# Patient Record
Sex: Female | Born: 1965 | Race: White | Hispanic: No | Smoking: Never smoker
Health system: Southern US, Community
[De-identification: ages and names within clinical notes are randomized; demographics above are authoritative.]

## PROBLEM LIST (undated history)

## (undated) DIAGNOSIS — F988 Other specified behavioral and emotional disorders with onset usually occurring in childhood and adolescence: Secondary | ICD-10-CM

## (undated) HISTORY — PX: BREAST SURGERY: SHX581

## (undated) HISTORY — PX: ECTOPIC PREGNANCY SURGERY: SHX613

---

## 1998-03-10 ENCOUNTER — Other Ambulatory Visit: Admission: RE | Admit: 1998-03-10 | Discharge: 1998-03-10 | Payer: Self-pay | Admitting: Obstetrics and Gynecology

## 1998-06-18 ENCOUNTER — Inpatient Hospital Stay (HOSPITAL_COMMUNITY): Admission: AD | Admit: 1998-06-18 | Discharge: 1998-06-18 | Payer: Self-pay | Admitting: Obstetrics and Gynecology

## 1998-07-22 ENCOUNTER — Encounter: Payer: Self-pay | Admitting: Obstetrics and Gynecology

## 1998-07-22 ENCOUNTER — Inpatient Hospital Stay (HOSPITAL_COMMUNITY): Admission: AD | Admit: 1998-07-22 | Discharge: 1998-07-22 | Payer: Self-pay | Admitting: Obstetrics and Gynecology

## 1998-09-10 ENCOUNTER — Other Ambulatory Visit: Admission: RE | Admit: 1998-09-10 | Discharge: 1998-09-10 | Payer: Self-pay | Admitting: Obstetrics and Gynecology

## 1999-11-02 ENCOUNTER — Other Ambulatory Visit: Admission: RE | Admit: 1999-11-02 | Discharge: 1999-11-02 | Payer: Self-pay | Admitting: Obstetrics and Gynecology

## 2000-11-10 ENCOUNTER — Other Ambulatory Visit: Admission: RE | Admit: 2000-11-10 | Discharge: 2000-11-10 | Payer: Self-pay | Admitting: Obstetrics and Gynecology

## 2007-10-10 ENCOUNTER — Encounter: Admission: RE | Admit: 2007-10-10 | Discharge: 2007-10-10 | Payer: Self-pay | Admitting: Obstetrics and Gynecology

## 2008-11-04 ENCOUNTER — Encounter: Admission: RE | Admit: 2008-11-04 | Discharge: 2008-11-04 | Payer: Self-pay | Admitting: Obstetrics and Gynecology

## 2008-11-07 ENCOUNTER — Encounter: Admission: RE | Admit: 2008-11-07 | Discharge: 2008-11-07 | Payer: Self-pay | Admitting: Obstetrics and Gynecology

## 2009-10-07 ENCOUNTER — Ambulatory Visit (HOSPITAL_COMMUNITY)
Admission: AD | Admit: 2009-10-07 | Discharge: 2009-10-08 | Payer: Self-pay | Source: Home / Self Care | Admitting: Obstetrics & Gynecology

## 2010-07-07 ENCOUNTER — Ambulatory Visit (HOSPITAL_BASED_OUTPATIENT_CLINIC_OR_DEPARTMENT_OTHER)
Admission: RE | Admit: 2010-07-07 | Discharge: 2010-07-07 | Payer: Self-pay | Source: Home / Self Care | Attending: Obstetrics and Gynecology | Admitting: Obstetrics and Gynecology

## 2010-07-19 ENCOUNTER — Encounter: Payer: Self-pay | Admitting: Obstetrics and Gynecology

## 2010-07-20 ENCOUNTER — Encounter: Payer: Self-pay | Admitting: Obstetrics and Gynecology

## 2010-09-16 LAB — CBC
MCHC: 34.6 g/dL (ref 30.0–36.0)
MCV: 93.1 fL (ref 78.0–100.0)
Platelets: 240 10*3/uL (ref 150–400)
RBC: 3.71 MIL/uL — ABNORMAL LOW (ref 3.87–5.11)
RDW: 13.7 % (ref 11.5–15.5)

## 2010-09-16 LAB — SAMPLE TO BLOOD BANK

## 2010-09-16 LAB — HCG, QUANTITATIVE, PREGNANCY: hCG, Beta Chain, Quant, S: 4059 m[IU]/mL — ABNORMAL HIGH (ref <5)

## 2010-09-16 LAB — TYPE AND SCREEN: Antibody Screen: NEGATIVE

## 2011-07-01 ENCOUNTER — Other Ambulatory Visit: Payer: Self-pay | Admitting: Obstetrics and Gynecology

## 2011-07-01 DIAGNOSIS — N644 Mastodynia: Secondary | ICD-10-CM

## 2011-07-09 ENCOUNTER — Other Ambulatory Visit: Payer: Self-pay

## 2011-08-31 ENCOUNTER — Other Ambulatory Visit (HOSPITAL_BASED_OUTPATIENT_CLINIC_OR_DEPARTMENT_OTHER): Payer: Self-pay | Admitting: Obstetrics and Gynecology

## 2011-08-31 DIAGNOSIS — Z1231 Encounter for screening mammogram for malignant neoplasm of breast: Secondary | ICD-10-CM

## 2011-09-03 ENCOUNTER — Ambulatory Visit (HOSPITAL_BASED_OUTPATIENT_CLINIC_OR_DEPARTMENT_OTHER)
Admission: RE | Admit: 2011-09-03 | Discharge: 2011-09-03 | Disposition: A | Discharge status: Home / Self Care | Payer: BC Managed Care – PPO | Source: Ambulatory Visit | Attending: Obstetrics and Gynecology | Admitting: Obstetrics and Gynecology

## 2011-09-03 DIAGNOSIS — Z1231 Encounter for screening mammogram for malignant neoplasm of breast: Secondary | ICD-10-CM

## 2012-09-14 ENCOUNTER — Other Ambulatory Visit (HOSPITAL_BASED_OUTPATIENT_CLINIC_OR_DEPARTMENT_OTHER): Payer: Self-pay | Admitting: Obstetrics and Gynecology

## 2012-09-14 DIAGNOSIS — Z1231 Encounter for screening mammogram for malignant neoplasm of breast: Secondary | ICD-10-CM

## 2012-09-21 ENCOUNTER — Inpatient Hospital Stay (HOSPITAL_BASED_OUTPATIENT_CLINIC_OR_DEPARTMENT_OTHER): Admission: RE | Admit: 2012-09-21 | Payer: BC Managed Care – PPO | Source: Ambulatory Visit

## 2012-09-26 ENCOUNTER — Ambulatory Visit (HOSPITAL_BASED_OUTPATIENT_CLINIC_OR_DEPARTMENT_OTHER)
Admission: RE | Admit: 2012-09-26 | Discharge: 2012-09-26 | Disposition: A | Discharge status: Home / Self Care | Payer: BC Managed Care – PPO | Source: Ambulatory Visit | Attending: Obstetrics and Gynecology | Admitting: Obstetrics and Gynecology

## 2012-09-26 DIAGNOSIS — Z1231 Encounter for screening mammogram for malignant neoplasm of breast: Secondary | ICD-10-CM

## 2013-03-02 ENCOUNTER — Ambulatory Visit (INDEPENDENT_AMBULATORY_CARE_PROVIDER_SITE_OTHER): Payer: BC Managed Care – PPO | Admitting: Podiatry

## 2013-03-02 DIAGNOSIS — M25579 Pain in unspecified ankle and joints of unspecified foot: Secondary | ICD-10-CM

## 2013-03-02 DIAGNOSIS — M21969 Unspecified acquired deformity of unspecified lower leg: Secondary | ICD-10-CM

## 2013-03-02 DIAGNOSIS — M25571 Pain in right ankle and joints of right foot: Secondary | ICD-10-CM

## 2013-03-02 DIAGNOSIS — M21619 Bunion of unspecified foot: Secondary | ICD-10-CM

## 2013-03-02 DIAGNOSIS — M2010 Hallux valgus (acquired), unspecified foot: Secondary | ICD-10-CM

## 2013-03-02 MED ORDER — ARCH BANDAGE MISC: 2.0000 | Freq: Every morning | Status: AC

## 2013-03-02 NOTE — Progress Notes (Signed)
Subjective:  Right foot bunion hurts. Has had for over 2-3 years and progressively getting bigger especially last year.  Left foot 2nd and 3rd toes are spreading, noted past 2-3 weeks.   Objective: Dermatologic: Normal findings. Vascular: All pedal pulses are palpable. No edema or erythema noted. Neurologic: All epicritic and tactile sensations grossly intact. Orthopedic: Hallux valgus bilateral, bunion deformity bilateral, spreading 2nd and 3rd digit left, pain on right bunion. Excess motion at the base of the first MCJ bilateral. X-rays taken:  Findings reveal short first Metatarsal bilateral, medially deviating 2nd MPJ L>R. Enlarged first Metatarsal head bilateral. Fibular sesamoid position 4 right, 3 left.  Assessment: Hallux valgus with bunion bilateral, right foot symptomatic. Hypermobility of the first MCJ bilateral.  Plan: Reviewed clinical findings, X-ray findings, and available treatment options including surgical options ([place] with Adin Hector). Metatarsal binder dispensed. Patient will return to prepare for Orthotics.

## 2013-11-13 ENCOUNTER — Other Ambulatory Visit (HOSPITAL_BASED_OUTPATIENT_CLINIC_OR_DEPARTMENT_OTHER): Payer: Self-pay | Admitting: Obstetrics and Gynecology

## 2013-11-13 DIAGNOSIS — Z1231 Encounter for screening mammogram for malignant neoplasm of breast: Secondary | ICD-10-CM

## 2013-11-16 ENCOUNTER — Ambulatory Visit (HOSPITAL_BASED_OUTPATIENT_CLINIC_OR_DEPARTMENT_OTHER): Payer: BC Managed Care – PPO

## 2013-11-23 ENCOUNTER — Ambulatory Visit (HOSPITAL_BASED_OUTPATIENT_CLINIC_OR_DEPARTMENT_OTHER)
Admission: RE | Admit: 2013-11-23 | Discharge: 2013-11-23 | Disposition: A | Discharge status: Home / Self Care | Payer: BC Managed Care – PPO | Source: Ambulatory Visit | Attending: Obstetrics and Gynecology | Admitting: Obstetrics and Gynecology

## 2013-11-23 DIAGNOSIS — Z1231 Encounter for screening mammogram for malignant neoplasm of breast: Secondary | ICD-10-CM | POA: Insufficient documentation

## 2017-03-04 ENCOUNTER — Encounter (HOSPITAL_BASED_OUTPATIENT_CLINIC_OR_DEPARTMENT_OTHER): Payer: Self-pay | Admitting: *Deleted

## 2017-03-04 DIAGNOSIS — K353 Acute appendicitis with localized peritonitis: Secondary | ICD-10-CM | POA: Diagnosis not present

## 2017-03-04 DIAGNOSIS — F988 Other specified behavioral and emotional disorders with onset usually occurring in childhood and adolescence: Secondary | ICD-10-CM | POA: Diagnosis not present

## 2017-03-04 DIAGNOSIS — Z79899 Other long term (current) drug therapy: Secondary | ICD-10-CM | POA: Diagnosis not present

## 2017-03-04 DIAGNOSIS — R109 Unspecified abdominal pain: Secondary | ICD-10-CM | POA: Diagnosis present

## 2017-03-04 LAB — URINALYSIS, ROUTINE W REFLEX MICROSCOPIC
BILIRUBIN URINE: NEGATIVE
Glucose, UA: NEGATIVE mg/dL
KETONES UR: NEGATIVE mg/dL
LEUKOCYTES UA: NEGATIVE
NITRITE: NEGATIVE
PH: 6 (ref 5.0–8.0)
Protein, ur: NEGATIVE mg/dL
Specific Gravity, Urine: 1.01 (ref 1.005–1.030)

## 2017-03-04 LAB — URINALYSIS, MICROSCOPIC (REFLEX)

## 2017-03-04 LAB — PREGNANCY, URINE: Preg Test, Ur: NEGATIVE

## 2017-03-04 NOTE — ED Triage Notes (Signed)
Abdominal pain x 2 days. Bloating. Feels like gas. Pain is worse in her right lower quadrant.

## 2017-03-05 ENCOUNTER — Encounter (HOSPITAL_COMMUNITY): Payer: Self-pay | Admitting: Surgery

## 2017-03-05 ENCOUNTER — Observation Stay (HOSPITAL_BASED_OUTPATIENT_CLINIC_OR_DEPARTMENT_OTHER)
Admission: EM | Admit: 2017-03-05 | Discharge: 2017-03-06 | Disposition: A | Discharge status: Home / Self Care | Payer: BLUE CROSS/BLUE SHIELD | Attending: Surgery | Admitting: Surgery

## 2017-03-05 ENCOUNTER — Encounter (HOSPITAL_COMMUNITY)
Admission: EM | Disposition: A | Discharge status: Home / Self Care | Payer: Self-pay | Source: Home / Self Care | Attending: Emergency Medicine

## 2017-03-05 ENCOUNTER — Ambulatory Visit: Admit: 2017-03-05 | Payer: BLUE CROSS/BLUE SHIELD | Admitting: Surgery

## 2017-03-05 ENCOUNTER — Emergency Department (HOSPITAL_COMMUNITY): Payer: BLUE CROSS/BLUE SHIELD | Admitting: Anesthesiology

## 2017-03-05 ENCOUNTER — Emergency Department (HOSPITAL_BASED_OUTPATIENT_CLINIC_OR_DEPARTMENT_OTHER): Payer: BLUE CROSS/BLUE SHIELD

## 2017-03-05 DIAGNOSIS — K3580 Unspecified acute appendicitis: Secondary | ICD-10-CM

## 2017-03-05 DIAGNOSIS — K3532 Acute appendicitis with perforation and localized peritonitis, without abscess: Secondary | ICD-10-CM | POA: Diagnosis present

## 2017-03-05 HISTORY — PX: LAPAROSCOPIC APPENDECTOMY: SHX408

## 2017-03-05 HISTORY — DX: Other specified behavioral and emotional disorders with onset usually occurring in childhood and adolescence: F98.8

## 2017-03-05 LAB — CBC WITH DIFFERENTIAL/PLATELET
Basophils Absolute: 0 10*3/uL (ref 0.0–0.1)
Basophils Relative: 0 %
Eosinophils Absolute: 0 10*3/uL (ref 0.0–0.7)
Eosinophils Relative: 0 %
HEMATOCRIT: 36.7 % (ref 36.0–46.0)
HEMOGLOBIN: 12.4 g/dL (ref 12.0–15.0)
LYMPHS ABS: 0.9 10*3/uL (ref 0.7–4.0)
Lymphocytes Relative: 8 %
MCH: 31.7 pg (ref 26.0–34.0)
MCHC: 33.8 g/dL (ref 30.0–36.0)
MCV: 93.9 fL (ref 78.0–100.0)
MONO ABS: 0.9 10*3/uL (ref 0.1–1.0)
MONOS PCT: 8 %
NEUTROS ABS: 9.3 10*3/uL — AB (ref 1.7–7.7)
NEUTROS PCT: 84 %
Platelets: 236 10*3/uL (ref 150–400)
RBC: 3.91 MIL/uL (ref 3.87–5.11)
RDW: 12.4 % (ref 11.5–15.5)
WBC: 11.2 10*3/uL — ABNORMAL HIGH (ref 4.0–10.5)

## 2017-03-05 LAB — BASIC METABOLIC PANEL
ANION GAP: 6 (ref 5–15)
BUN: 12 mg/dL (ref 6–20)
CALCIUM: 8.6 mg/dL — AB (ref 8.9–10.3)
CHLORIDE: 101 mmol/L (ref 101–111)
CO2: 28 mmol/L (ref 22–32)
CREATININE: 0.72 mg/dL (ref 0.44–1.00)
GFR calc non Af Amer: >60 mL/min (ref >60)
GLUCOSE: 106 mg/dL — AB (ref 65–99)
Potassium: 3.8 mmol/L (ref 3.5–5.1)
Sodium: 135 mmol/L (ref 135–145)

## 2017-03-05 SURGERY — APPENDECTOMY, LAPAROSCOPIC
Anesthesia: General | Site: Abdomen

## 2017-03-05 MED ORDER — DEXAMETHASONE SODIUM PHOSPHATE 10 MG/ML IJ SOLN
INTRAMUSCULAR | Status: AC
  Filled 2017-03-05: qty 1

## 2017-03-05 MED ORDER — METRONIDAZOLE IN NACL 5-0.79 MG/ML-% IV SOLN
500.0000 mg | Freq: Three times a day (TID) | INTRAVENOUS | Status: DC
  Administered 2017-03-05 – 2017-03-06 (×3): 500 mg via INTRAVENOUS
  Filled 2017-03-05 (×4): qty 100

## 2017-03-05 MED ORDER — SUGAMMADEX SODIUM 200 MG/2ML IV SOLN
INTRAVENOUS | Status: DC | PRN
  Administered 2017-03-05: 150 mg via INTRAVENOUS

## 2017-03-05 MED ORDER — DEXAMETHASONE SODIUM PHOSPHATE 10 MG/ML IJ SOLN
INTRAMUSCULAR | Status: DC | PRN
  Administered 2017-03-05: 10 mg via INTRAVENOUS

## 2017-03-05 MED ORDER — ONDANSETRON 4 MG PO TBDP: 4.0000 mg | ORAL_TABLET | Freq: Four times a day (QID) | ORAL | Status: DC | PRN

## 2017-03-05 MED ORDER — SUGAMMADEX SODIUM 200 MG/2ML IV SOLN
INTRAVENOUS | Status: AC
  Filled 2017-03-05: qty 2

## 2017-03-05 MED ORDER — HYDROMORPHONE HCL-NACL 0.5-0.9 MG/ML-% IV SOSY: 1.0000 mg | PREFILLED_SYRINGE | INTRAVENOUS | Status: DC | PRN

## 2017-03-05 MED ORDER — ACETAMINOPHEN 10 MG/ML IV SOLN
INTRAVENOUS | Status: AC
  Administered 2017-03-05: 10:00:00
  Filled 2017-03-05: qty 100

## 2017-03-05 MED ORDER — HYDROMORPHONE HCL-NACL 0.5-0.9 MG/ML-% IV SOSY
PREFILLED_SYRINGE | INTRAVENOUS | Status: AC
  Administered 2017-03-05: 10:00:00
  Filled 2017-03-05: qty 2

## 2017-03-05 MED ORDER — SUCCINYLCHOLINE CHLORIDE 200 MG/10ML IV SOSY
PREFILLED_SYRINGE | INTRAVENOUS | Status: AC
  Filled 2017-03-05: qty 10

## 2017-03-05 MED ORDER — PROPOFOL 10 MG/ML IV BOLUS
INTRAVENOUS | Status: AC
  Filled 2017-03-05: qty 60

## 2017-03-05 MED ORDER — PROPOFOL 500 MG/50ML IV EMUL
INTRAVENOUS | Status: DC | PRN
  Administered 2017-03-05: 150 ug/kg/min via INTRAVENOUS

## 2017-03-05 MED ORDER — BUPIVACAINE-EPINEPHRINE (PF) 0.25% -1:200000 IJ SOLN
INTRAMUSCULAR | Status: AC
  Filled 2017-03-05: qty 30

## 2017-03-05 MED ORDER — MIDAZOLAM HCL 2 MG/2ML IJ SOLN
INTRAMUSCULAR | Status: AC
  Filled 2017-03-05: qty 2

## 2017-03-05 MED ORDER — ACETAMINOPHEN 325 MG PO TABS
650.0000 mg | ORAL_TABLET | Freq: Four times a day (QID) | ORAL | Status: DC | PRN
  Administered 2017-03-06: 650 mg via ORAL
  Filled 2017-03-05: qty 2

## 2017-03-05 MED ORDER — SCOPOLAMINE 1 MG/3DAYS TD PT72
MEDICATED_PATCH | TRANSDERMAL | Status: AC
  Filled 2017-03-05: qty 1

## 2017-03-05 MED ORDER — PROMETHAZINE HCL 25 MG/ML IJ SOLN: 6.2500 mg | INTRAMUSCULAR | Status: DC | PRN

## 2017-03-05 MED ORDER — ACETAMINOPHEN 10 MG/ML IV SOLN
1000.0000 mg | Freq: Once | INTRAVENOUS | Status: AC
  Administered 2017-03-05: 1000 mg via INTRAVENOUS

## 2017-03-05 MED ORDER — BUPIVACAINE-EPINEPHRINE 0.25% -1:200000 IJ SOLN
INTRAMUSCULAR | Status: DC | PRN
  Administered 2017-03-05: 20 mL

## 2017-03-05 MED ORDER — METRONIDAZOLE IN NACL 5-0.79 MG/ML-% IV SOLN
500.0000 mg | Freq: Once | INTRAVENOUS | Status: AC
  Administered 2017-03-05: 500 mg via INTRAVENOUS
  Filled 2017-03-05: qty 100

## 2017-03-05 MED ORDER — LACTATED RINGERS IR SOLN
Status: DC | PRN
  Administered 2017-03-05: 1000 mL

## 2017-03-05 MED ORDER — FENTANYL CITRATE (PF) 250 MCG/5ML IJ SOLN
INTRAMUSCULAR | Status: DC | PRN
  Administered 2017-03-05: 75 ug via INTRAVENOUS
  Administered 2017-03-05: 50 ug via INTRAVENOUS
  Administered 2017-03-05: 75 ug via INTRAVENOUS

## 2017-03-05 MED ORDER — FENTANYL CITRATE (PF) 100 MCG/2ML IJ SOLN
50.0000 ug | Freq: Once | INTRAMUSCULAR | Status: AC
  Administered 2017-03-05: 50 ug via INTRAVENOUS
  Filled 2017-03-05: qty 2

## 2017-03-05 MED ORDER — LACTATED RINGERS IV SOLN: INTRAVENOUS | Status: DC

## 2017-03-05 MED ORDER — ROCURONIUM BROMIDE 50 MG/5ML IV SOSY
PREFILLED_SYRINGE | INTRAVENOUS | Status: AC
  Filled 2017-03-05: qty 5

## 2017-03-05 MED ORDER — DEXTROSE 5 % IV SOLN
2.0000 g | INTRAVENOUS | Status: DC
  Administered 2017-03-06: 2 g via INTRAVENOUS
  Filled 2017-03-05: qty 2

## 2017-03-05 MED ORDER — IOPAMIDOL (ISOVUE-300) INJECTION 61%
100.0000 mL | Freq: Once | INTRAVENOUS | Status: AC | PRN
  Administered 2017-03-05: 100 mL via INTRAVENOUS

## 2017-03-05 MED ORDER — SCOPOLAMINE 1 MG/3DAYS TD PT72
MEDICATED_PATCH | TRANSDERMAL | Status: DC | PRN
  Administered 2017-03-05: 1 via TRANSDERMAL

## 2017-03-05 MED ORDER — ROCURONIUM BROMIDE 10 MG/ML (PF) SYRINGE
PREFILLED_SYRINGE | INTRAVENOUS | Status: DC | PRN
  Administered 2017-03-05: 40 mg via INTRAVENOUS

## 2017-03-05 MED ORDER — DEXTROSE 5 % IV SOLN
2.0000 g | Freq: Once | INTRAVENOUS | Status: AC
  Administered 2017-03-05: 2 g via INTRAVENOUS
  Filled 2017-03-05: qty 2

## 2017-03-05 MED ORDER — HYDROMORPHONE HCL-NACL 0.5-0.9 MG/ML-% IV SOSY
0.2500 mg | PREFILLED_SYRINGE | INTRAVENOUS | Status: DC | PRN
  Administered 2017-03-05: 0.5 mg via INTRAVENOUS

## 2017-03-05 MED ORDER — LIDOCAINE 2% (20 MG/ML) 5 ML SYRINGE
INTRAMUSCULAR | Status: AC
  Filled 2017-03-05: qty 5

## 2017-03-05 MED ORDER — LIDOCAINE 2% (20 MG/ML) 5 ML SYRINGE: INTRAMUSCULAR | Status: DC | PRN

## 2017-03-05 MED ORDER — ONDANSETRON HCL 4 MG/2ML IJ SOLN: 4.0000 mg | Freq: Four times a day (QID) | INTRAMUSCULAR | Status: DC | PRN

## 2017-03-05 MED ORDER — ONDANSETRON HCL 4 MG/2ML IJ SOLN
INTRAMUSCULAR | Status: AC
  Filled 2017-03-05: qty 2

## 2017-03-05 MED ORDER — PROPOFOL 10 MG/ML IV BOLUS
INTRAVENOUS | Status: AC
  Filled 2017-03-05: qty 20

## 2017-03-05 MED ORDER — KCL IN DEXTROSE-NACL 20-5-0.45 MEQ/L-%-% IV SOLN
INTRAVENOUS | Status: DC
  Administered 2017-03-05 – 2017-03-06 (×2): via INTRAVENOUS
  Filled 2017-03-05 (×3): qty 1000

## 2017-03-05 MED ORDER — ACETAMINOPHEN 650 MG RE SUPP: 650.0000 mg | Freq: Four times a day (QID) | RECTAL | Status: DC | PRN

## 2017-03-05 MED ORDER — ONDANSETRON HCL 4 MG/2ML IJ SOLN
INTRAMUSCULAR | Status: DC | PRN
  Administered 2017-03-05: 4 mg via INTRAVENOUS

## 2017-03-05 MED ORDER — MIDAZOLAM HCL 5 MG/5ML IJ SOLN
INTRAMUSCULAR | Status: DC | PRN
  Administered 2017-03-05: 2 mg via INTRAVENOUS

## 2017-03-05 MED ORDER — MEPERIDINE HCL 50 MG/ML IJ SOLN: 6.2500 mg | INTRAMUSCULAR | Status: DC | PRN

## 2017-03-05 MED ORDER — LACTATED RINGERS IV SOLN
INTRAVENOUS | Status: DC | PRN
  Administered 2017-03-05: 08:00:00 via INTRAVENOUS

## 2017-03-05 MED ORDER — FENTANYL CITRATE (PF) 250 MCG/5ML IJ SOLN
INTRAMUSCULAR | Status: AC
  Filled 2017-03-05: qty 5

## 2017-03-05 MED ORDER — HYDROCODONE-ACETAMINOPHEN 5-325 MG PO TABS
1.0000 | ORAL_TABLET | ORAL | Status: DC | PRN
  Administered 2017-03-05 – 2017-03-06 (×2): 1 via ORAL
  Filled 2017-03-05: qty 1
  Filled 2017-03-05: qty 2

## 2017-03-05 MED ORDER — ONDANSETRON HCL 4 MG/2ML IJ SOLN
4.0000 mg | Freq: Once | INTRAMUSCULAR | Status: AC
  Administered 2017-03-05: 4 mg via INTRAVENOUS
  Filled 2017-03-05: qty 2

## 2017-03-05 MED ORDER — PROPOFOL 10 MG/ML IV BOLUS
INTRAVENOUS | Status: DC | PRN
  Administered 2017-03-05: 30 mg via INTRAVENOUS
  Administered 2017-03-05: 150 mg via INTRAVENOUS

## 2017-03-05 SURGICAL SUPPLY — 30 items
APPLIER CLIP ROT 10 11.4 M/L (STAPLE)
CHLORAPREP W/TINT 26ML (MISCELLANEOUS) ×2 IMPLANT
CLIP APPLIE ROT 10 11.4 M/L (STAPLE) IMPLANT
COVER SURGICAL LIGHT HANDLE (MISCELLANEOUS) ×2 IMPLANT
CUTTER FLEX LINEAR 45M (STAPLE) ×2 IMPLANT
DECANTER SPIKE VIAL GLASS SM (MISCELLANEOUS) ×2 IMPLANT
DRAPE LAPAROSCOPIC ABDOMINAL (DRAPES) ×2 IMPLANT
ELECT REM PT RETURN 15FT ADLT (MISCELLANEOUS) ×2 IMPLANT
ENDOLOOP SUT PDS II  0 18 (SUTURE)
ENDOLOOP SUT PDS II 0 18 (SUTURE) IMPLANT
GAUZE SPONGE 2X2 8PLY STRL LF (GAUZE/BANDAGES/DRESSINGS) ×1 IMPLANT
GLOVE SURG ORTHO 8.0 STRL STRW (GLOVE) ×2 IMPLANT
GOWN STRL REUS W/TWL XL LVL3 (GOWN DISPOSABLE) ×2 IMPLANT
KIT BASIN OR (CUSTOM PROCEDURE TRAY) ×2 IMPLANT
POUCH SPECIMEN RETRIEVAL 10MM (ENDOMECHANICALS) ×2 IMPLANT
RELOAD 45 VASCULAR/THIN (ENDOMECHANICALS) IMPLANT
RELOAD STAPLE TA45 3.5 REG BLU (ENDOMECHANICALS) ×4 IMPLANT
SET IRRIG TUBING LAPAROSCOPIC (IRRIGATION / IRRIGATOR) ×2 IMPLANT
SHEARS HARMONIC ACE PLUS 36CM (ENDOMECHANICALS) ×2 IMPLANT
SPONGE GAUZE 2X2 STER 10/PKG (GAUZE/BANDAGES/DRESSINGS) ×1
STRIP CLOSURE SKIN 1/2X4 (GAUZE/BANDAGES/DRESSINGS) ×2 IMPLANT
SUT MNCRL AB 4-0 PS2 18 (SUTURE) ×2 IMPLANT
TAPE CLOTH SURG 4X10 WHT LF (GAUZE/BANDAGES/DRESSINGS) ×2 IMPLANT
TOWEL OR 17X26 10 PK STRL BLUE (TOWEL DISPOSABLE) ×2 IMPLANT
TOWEL OR NON WOVEN STRL DISP B (DISPOSABLE) ×2 IMPLANT
TRAY FOLEY W/METER SILVER 14FR (SET/KITS/TRAYS/PACK) ×2 IMPLANT
TRAY FOLEY W/METER SILVER 16FR (SET/KITS/TRAYS/PACK) IMPLANT
TRAY LAPAROSCOPIC (CUSTOM PROCEDURE TRAY) ×2 IMPLANT
TROCAR XCEL BLUNT TIP 100MML (ENDOMECHANICALS) ×2 IMPLANT
TROCAR XCEL NON-BLD 11X100MML (ENDOMECHANICALS) ×2 IMPLANT

## 2017-03-05 NOTE — Anesthesia Postprocedure Evaluation (Signed)
Anesthesia Post Note  Patient: [facility]  Procedure(s) Performed: Procedure(s) (LRB): APPENDECTOMY LAPAROSCOPIC (N/A)     Patient location during evaluation: PACU Anesthesia Type: General Level of consciousness: awake and alert Pain management: pain level controlled Vital Signs Assessment: post-procedure vital signs reviewed and stable Respiratory status: spontaneous breathing, nonlabored ventilation, respiratory function stable and patient connected to nasal cannula oxygen Cardiovascular status: blood pressure returned to baseline and stable Postop Assessment: no signs of nausea or vomiting Anesthetic complications: no    Last Vitals:  Vitals:   03/05/17 1030 03/05/17 1053  BP: 97/65 100/63  Pulse: 82 70  Resp: 14 14  Temp: 36.5 C 36.8 C  SpO2: 98% 100%    Last Pain:  Vitals:   03/05/17 1015  TempSrc:   PainSc: 2                  Shelton SilvasKevin D Tally Mattox

## 2017-03-05 NOTE — ED Notes (Signed)
[facility] notified Catering manager(Amber) - [facility]

## 2017-03-05 NOTE — Anesthesia Procedure Notes (Signed)
Procedure Name: Intubation Date/Time: 03/05/2017 8:19 AM Performed by: Talbot Grumbling Pre-anesthesia Checklist: Patient identified, Emergency Drugs available, Suction available and Patient being monitored Patient Re-evaluated:Patient Re-evaluated prior to induction Oxygen Delivery Method: Circle system utilized Preoxygenation: Pre-oxygenation with 100% oxygen Induction Type: IV induction Ventilation: Mask ventilation without difficulty Laryngoscope Size: Mac and 3 Grade View: Grade I Tube type: Oral Tube size: 7.0 mm Number of attempts: 1 Airway Equipment and Method: Stylet Placement Confirmation: ETT inserted through vocal cords under direct vision,  positive ETCO2 and breath sounds checked- equal and bilateral Secured at: 21 cm Tube secured with: Tape Dental Injury: Teeth and Oropharynx as per pre-operative assessment

## 2017-03-05 NOTE — H&P (Signed)
Madison Higgins is an 51 y.o. female.    General Surgery [facility]  Chief Complaint: abdominal pain, acute appendicitis  HPI: patient is a 51 yo WF admitted from [facility] with acute appendicitis.  Patient developed abdominal pain two days ago.  Localized to RLQ.  Denies fever or chills, nausea or emesis.  Took suppository and laxative with BM without relief.  Seen at [facility].  WBC 11K.  CTA positive for acute appendicitis.  Previous salpingo-oophorectomy for bleeding.  Mother at bedside.  Past Medical History:  Diagnosis Date  . ADD (attention deficit disorder)     Past Surgical History:  Procedure Laterality Date  . BREAST SURGERY    . ECTOPIC PREGNANCY SURGERY      History reviewed. No pertinent family history. Social History:  reports that she has never smoked. She has never used smokeless tobacco. She reports that she drinks alcohol. She reports that she does not use drugs.  Allergies: No Known Allergies   (Not in a hospital admission)  Results for orders placed or performed during the hospital encounter of 03/05/17 (from the past 48 hour(s))  Urinalysis, Routine w reflex microscopic     Status: Abnormal   Collection Time: 03/04/17 11:01 PM  Result Value Ref Range   Color, Urine YELLOW YELLOW   APPearance CLEAR CLEAR   Specific Gravity, Urine 1.010 1.005 - 1.030   pH 6.0 5.0 - 8.0   Glucose, UA NEGATIVE NEGATIVE mg/dL   Hgb urine dipstick SMALL (A) NEGATIVE   Bilirubin Urine NEGATIVE NEGATIVE   Ketones, ur NEGATIVE NEGATIVE mg/dL   Protein, ur NEGATIVE NEGATIVE mg/dL   Nitrite NEGATIVE NEGATIVE   Leukocytes, UA NEGATIVE NEGATIVE  Pregnancy, urine     Status: None   Collection Time: 03/04/17 11:01 PM  Result Value Ref Range   Preg Test, Ur NEGATIVE NEGATIVE    Comment:        THE SENSITIVITY OF THIS METHODOLOGY IS >20 mIU/mL.   Urinalysis, Microscopic (reflex)     Status: Abnormal   Collection Time: 03/04/17 11:01 PM  Result  Value Ref Range   RBC / HPF 0-5 0 - 5 RBC/hpf   WBC, UA 0-5 0 - 5 WBC/hpf   Bacteria, UA RARE (A) NONE SEEN   Squamous Epithelial / LPF 0-5 (A) NONE SEEN   Mucus PRESENT   Basic metabolic panel     Status: Abnormal   Collection Time: 03/05/17  3:52 AM  Result Value Ref Range   Sodium 135 135 - 145 mmol/L   Potassium 3.8 3.5 - 5.1 mmol/L   Chloride 101 101 - 111 mmol/L   CO2 28 22 - 32 mmol/L   Glucose, Bld 106 (H) 65 - 99 mg/dL   BUN 12 6 - 20 mg/dL   Creatinine, Ser 0.72 0.44 - 1.00 mg/dL   Calcium 8.6 (L) 8.9 - 10.3 mg/dL   GFR calc non Af Amer >60 >60 mL/min   GFR calc Af Amer >60 >60 mL/min    Comment: (NOTE) The eGFR has been calculated using the CKD EPI equation. This calculation has not been validated in all clinical situations. eGFR's persistently <60 mL/min signify possible Chronic Kidney Disease.    Anion gap 6 5 - 15  CBC with Differential/Platelet     Status: Abnormal   Collection Time: 03/05/17  3:52 AM  Result Value Ref Range   WBC 11.2 (H) 4.0 - 10.5 K/uL   RBC 3.91 3.87 - 5.11 MIL/uL  Hemoglobin 12.4 12.0 - 15.0 g/dL   HCT 96.2 22.9 - 79.8 %   MCV 93.9 78.0 - 100.0 fL   MCH 31.7 26.0 - 34.0 pg   MCHC 33.8 30.0 - 36.0 g/dL   RDW 92.1 19.4 - 17.4 %   Platelets 236 150 - 400 K/uL   Neutrophils Relative % 84 %   Neutro Abs 9.3 (H) 1.7 - 7.7 K/uL   Lymphocytes Relative 8 %   Lymphs Abs 0.9 0.7 - 4.0 K/uL   Monocytes Relative 8 %   Monocytes Absolute 0.9 0.1 - 1.0 K/uL   Eosinophils Relative 0 %   Eosinophils Absolute 0.0 0.0 - 0.7 K/uL   Basophils Relative 0 %   Basophils Absolute 0.0 0.0 - 0.1 K/uL   Ct Abdomen Pelvis W Contrast  Result Date: 03/05/2017 CLINICAL DATA:  Right lower quadrant pain for 2 days. Previous history of ruptured ectopic pregnancy. EXAM: CT ABDOMEN AND PELVIS WITH CONTRAST TECHNIQUE: Multidetector CT imaging of the abdomen and pelvis was performed using the standard protocol following bolus administration of intravenous contrast.  CONTRAST:  ISOVUE-300 IOPAMIDOL (ISOVUE-300) INJECTION 61% COMPARISON:  None. FINDINGS: Lower chest: Mild dependent changes in the lung bases. Right breast prosthesis. Hepatobiliary: No focal liver abnormality is seen. No gallstones, gallbladder wall thickening, or biliary dilatation. Pancreas: Unremarkable. No pancreatic ductal dilatation or surrounding inflammatory changes. Spleen: Normal in size without focal abnormality. Adrenals/Urinary Tract: Adrenal glands are unremarkable. Kidneys are normal, without renal calculi, focal lesion, or hydronephrosis. Bladder is unremarkable. Stomach/Bowel: Stomach, small bowel, and colon are not abnormally distended. No wall thickening is identified. The appendix is distended with diameter of 8 mm. Hyperemia of the appendiceal wall. Appendix is fluid filled. There is periappendiceal stranding and edema in the right lower quadrant. Changes are consistent with acute appendicitis. No abscess. Vascular/Lymphatic: No significant vascular findings are present. No enlarged abdominal or pelvic lymph nodes. Reproductive: Right ovarian cysts measuring 19 mm diameter, likely functional. No follow-up is indicated. Uterus and left ovary are not enlarged. Other: No free air in the abdomen. Abdominal wall musculature appears intact. Musculoskeletal: No acute or significant osseous findings. IMPRESSION: Distended fluid-filled appendix with periappendiceal stranding and edema consistent with acute appendicitis. No abscess. Electronically Signed   By: Burman Nieves M.D.   On: 03/05/2017 04:42    Review of Systems  Constitutional: Negative for chills, diaphoresis and fever.  HENT: Negative.   Eyes: Negative.   Respiratory: Negative.   Cardiovascular: Negative.   Gastrointestinal: Positive for abdominal pain (RLQ) and constipation. Negative for diarrhea, heartburn, nausea and vomiting.  Genitourinary: Negative.   Musculoskeletal: Negative.   Skin: Negative.   Neurological:  Negative.   Endo/Heme/Allergies: Negative.   Psychiatric/Behavioral: Negative.     Blood pressure 108/72, pulse 88, temperature 98.4 F (36.9 C), resp. rate 18, height 5\' 4"  (1.626 m), weight 56.2 kg (124 lb), SpO2 100 %. Physical Exam  Constitutional: She is oriented to person, place, and time. She appears well-developed and well-nourished. No distress.  HENT:  Head: Normocephalic and atraumatic.  Right Ear: External ear normal.  Left Ear: External ear normal.  Mouth/Throat: No oropharyngeal exudate.  Eyes: Pupils are equal, round, and reactive to light. Conjunctivae are normal. No scleral icterus.  Neck: Normal range of motion. Neck supple. No tracheal deviation present. No thyromegaly present.  Cardiovascular: Normal rate, regular rhythm and normal heart sounds.   No murmur heard. Respiratory: Effort normal and breath sounds normal. No respiratory distress. She has no wheezes.  GI: Soft. Bowel sounds are normal. She exhibits no distension and no mass. There is tenderness (RLQ). There is rebound and guarding.  Musculoskeletal: Normal range of motion. She exhibits no edema or deformity.  Neurological: She is alert and oriented to person, place, and time.  Skin: Skin is warm and dry. She is not diaphoretic.  Psychiatric: She has a normal mood and affect. Her behavior is normal.     Assessment/Plan Acute appendicitis  Plan lap appendectomy  Begin IV abx  Admit to general surgery service  OR aware  The risks and benefits of the procedure have been discussed at length with the patient.  The patient understands the proposed procedure, potential alternative treatments, and the course of recovery to be expected.  All of the patient's questions have been answered at this time.  The patient wishes to proceed with surgery.  Earnstine Regal, MD, [facility] Office: [place], MD 03/05/2017, 7:36 AM

## 2017-03-05 NOTE — Op Note (Addendum)
OPERATIVE REPORT - LAPAROSCOPIC APPENDECTOMY  Preop diagnosis: Acute appendicitis  Postop diagnosis: Acute appendicitis with perforation  Procedure: Laparoscopic appendectomy  Surgeon:  Velora Hecklerodd M. Copeland Neisen, MD, FACS  Anesthesia: General endotracheal  Estimated blood loss: Minimal  Preparation: Chlora-prep  Complications: None  Indications:  patient is a 51 yo WF admitted from [facility] with acute appendicitis.  Patient developed abdominal pain two days ago.  Localized to RLQ.  Denies fever or chills, nausea or emesis.  Took suppository and laxative with BM without relief.  Seen at [facility].  WBC 11K.  CTA positive for acute appendicitis.  Procedure:  Patient is brought to the operating room and placed in a supine position on the operating room table. Following administration of general anesthesia, a time out was held and the patient's name and procedure is confirmed. Patient is then prepped and draped in the usual strict aseptic fashion.  After ascertaining that an adequate level of anesthesia has been achieved, a peri-umbilical incision is made with a #15 blade. Dissection is carried down to the fascia. Fascia is incised in the midline and the peritoneal cavity is entered cautiously. A #0-vicryl pursestring suture is placed in the fascia. An Hassan cannula is introduced under direct vision and secured with the pursestring suture. The abdomen is insufflated with carbon dioxide. The laparoscope is introduced and the abdomen is explored. Operative ports are placed in the right upper quadrant and left lower quadrant. The appendix is identified. The appendix is distended with omental adhesions.  Upon manipulation, there is evidence of perforation with spillage of purulent fluid from the appendix. The mesoappendix is divided with the harmonic scalpel. Dissection is carried down to the base of the appendix. The base of the appendix is dissected out clearing the junction with the cecal wall. Using  an Endo-GIA stapler, the base of the appendix is transected at the junction with the cecal wall. There is good approximation of tissue along the staple line. There is good hemostasis along the staple line. The appendix is placed into an endo-catch bag and withdrawn through the umbilical port. The #0-vicryl pursestring suture is tied securely.  Right lower quadrant is irrigated with warm saline which is evacuated. Good hemostasis is noted. Ports are removed under direct vision. Good hemostasis is noted at the port sites. Pneumoperitoneum is released.  Skin incisions are anesthetized with local anesthetic. Wounds are closed with interrupted 4-0 Monocryl subcuticular sutures. Wounds are washed and dried and Steri-Strips are applied. Dressings are applied. The patient is awakened from anesthesia and brought to the recovery room. The patient tolerated the procedure well.  Velora Hecklerodd M. Dalma Panchal, MD, [facility] Office: [phone]

## 2017-03-05 NOTE — Transfer of Care (Signed)
Immediate Anesthesia Transfer of Care Note  Patient: [facility]  Procedure(s) Performed: Procedure(s): APPENDECTOMY LAPAROSCOPIC (N/A)  Patient Location: PACU  Anesthesia Type:General  Level of Consciousness:  sedated, patient cooperative and responds to stimulation  Airway & Oxygen Therapy:Patient Spontanous Breathing and Patient connected to face mask oxgen  Post-op Assessment:  Report given to PACU RN and Post -op Vital signs reviewed and stable  Post vital signs:  Reviewed and stable  Last Vitals:  Vitals:   03/05/17 0546 03/05/17 0656  BP: 101/71 108/72  Pulse: 87 88  Resp: 14 18  Temp:  36.9 C  SpO2: 100% 100%    Complications: No apparent anesthesia complications

## 2017-03-05 NOTE — ED Notes (Signed)
Pt transferred from [facility]-ED for further evaluation and treatment of her acute appendicitis.  Pt arrived to ED A/Ox4, in no s/s apparent distress observed---- pt stated, "no pain as long as I'm not moving too much".

## 2017-03-05 NOTE — ED Notes (Addendum)
Updated pt to wait time.  Pt states pain is in lower abdominal area, comes in waves

## 2017-03-05 NOTE — ED Notes (Signed)
Pt last ate at 0030 and had a few sips of water while in the lobby waiting for a room.  She is pain free at this time as long as she is not ambulating

## 2017-03-05 NOTE — Anesthesia Preprocedure Evaluation (Addendum)
Anesthesia Evaluation  Patient identified by MRN, date of birth, ID band Patient awake    Reviewed: Allergy & Precautions, NPO status , Patient's Chart, lab work & pertinent test results  Airway Mallampati: I  TM Distance: >3 FB Neck ROM: Full    Dental  (+) Dental Advisory Given, Chipped,    Pulmonary neg pulmonary ROS,    breath sounds clear to auscultation       Cardiovascular negative cardio ROS   Rhythm:Regular Rate:Normal     Neuro/Psych PSYCHIATRIC DISORDERS negative neurological ROS     GI/Hepatic negative GI ROS, Neg liver ROS,   Endo/Other  negative endocrine ROS  Renal/GU negative Renal ROS     Musculoskeletal negative musculoskeletal ROS (+)   Abdominal (+)  Abdomen: tender.    Peds  Hematology negative hematology ROS (+)   Anesthesia Other Findings Day of surgery medications reviewed with the patient.  Reproductive/Obstetrics                           Anesthesia Physical Anesthesia Plan  ASA: II and emergent  Anesthesia Plan: General   Post-op Pain Management:    Induction: Intravenous, Rapid sequence and Cricoid pressure planned  PONV Risk Score and Plan: 4 or greater and Ondansetron, Dexamethasone, Midazolam, Scopolamine patch - Pre-op and Treatment may vary due to age or medical condition  Airway Management Planned: Oral ETT  Additional Equipment:   Intra-op Plan:   Post-operative Plan: Extubation in OR  Informed Consent: I have reviewed the patients History and Physical, chart, labs and discussed the procedure including the risks, benefits and alternatives for the proposed anesthesia with the patient or authorized representative who has indicated his/her understanding and acceptance.   Dental advisory given  Plan Discussed with: CRNA  Anesthesia Plan Comments: (Pt's mother states her son has MH-like episode but all subsequent anesthetics were uneventful.  Ms. Romeo AppleHarrison states she's had multiple general anesthetics with gas and never had an issue. MH precautions implemented. )     Anesthesia Quick Evaluation

## 2017-03-05 NOTE — ED Provider Notes (Signed)
[facility] Provider Note   CSN: [id number] Arrival date & time: 03/04/17  2250     History   Chief Complaint Chief Complaint  Patient presents with  . Abdominal Pain    HPI Madison Higgins is a 51 y.o. female.  The history is provided by the patient and a parent.  Abdominal Pain   This is a new problem. The current episode started 2 days ago. The problem occurs daily. The problem has been gradually worsening. The pain is located in the RLQ. The pain is moderate. Associated symptoms include nausea. Pertinent negatives include fever, vomiting and dysuria. The symptoms are aggravated by certain positions and palpation. The symptoms are relieved by being still.  Patient reports onset of abdominal pain 2 days ago She reports it started after drinking a smoothie She had generalized pain that then radiated to RLQ She reports nausea She reports pain worsened on the car ride over to hospital She has never had this type of pain before  Past Medical History:  Diagnosis Date  . ADD (attention deficit disorder)     Patient Active Problem List   Diagnosis Date Noted  . Bunion of great toe 03/02/2013  . 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 Pain in joint, ankle and foot 03/02/2013    Past Surgical History:  Procedure Laterality Date  . BREAST SURGERY    . ECTOPIC PREGNANCY SURGERY      OB History    No data available       Home Medications    Prior to Admission medications   Medication Sig Start Date End Date Taking? Authorizing Provider  amphetamine-dextroamphetamine (ADDERALL) 20 MG tablet Take 20 mg by mouth daily. Two tabs daily    [provider]    Family History No family history on file.  Social History Social History  Substance Use Topics  . Smoking status: Never Smoker  . Smokeless tobacco: Never Used  . Alcohol use Yes     Allergies   Patient has no known allergies.   Review of Systems Review of Systems  Constitutional:  Negative for fever.  Cardiovascular: Negative for chest pain.  Gastrointestinal: Positive for abdominal pain and nausea. Negative for vomiting.  Genitourinary: Negative for dysuria and vaginal bleeding.  All other systems reviewed and are negative.    Physical Exam Updated Vital Signs BP 120/83 (BP Location: Right Arm)   Pulse 81   Temp 98.1 F (36.7 C) (Oral)   Resp 14   Ht 1.626 m ( )   Wt 56.2 kg (124 lb)   SpO2 100%   BMI 21.28 kg/m   Physical Exam CONSTITUTIONAL: Well developed/well nourished HEAD: Normocephalic/atraumatic EYES: EOMI/PERRL ENMT: Mucous membranes moist NECK: supple no meningeal signs SPINE/BACK:entire spine nontender CV: S1/S2 noted, no murmurs/rubs/gallops noted LUNGS: Lungs are clear to auscultation bilaterally, no apparent distress ABDOMEN: soft, mild diffuse tenderness, moderate RLQ tenderness, no rebound or guarding, bowel sounds noted throughout abdomen GU:no cva tenderness NEURO: Pt is awake/alert/appropriate, moves all extremitiesx4.  No facial droop.   EXTREMITIES: pulses normal/equal, full ROM SKIN: warm, color normal PSYCH: no abnormalities of mood noted, alert and oriented to situation   ED Treatments / Results  Labs (all labs ordered are listed, but only abnormal results are displayed) Labs Reviewed  URINALYSIS, ROUTINE W REFLEX MICROSCOPIC - Abnormal; Notable for the following:       Result Value   Hgb urine dipstick SMALL (*)    All other components within normal limits  URINALYSIS,  MICROSCOPIC (REFLEX) - Abnormal; Notable for the following:    Bacteria, UA RARE (*)    Squamous Epithelial / LPF 0-5 (*)    All other components within normal limits  PREGNANCY, URINE  BASIC METABOLIC PANEL  CBC WITH DIFFERENTIAL/PLATELET    EKG  EKG Interpretation None       Radiology Ct Abdomen Pelvis W Contrast  Result Date: 03/05/2017 CLINICAL DATA:  Right lower quadrant pain for 2 days. Previous history of ruptured ectopic  pregnancy. EXAM: CT ABDOMEN AND PELVIS WITH CONTRAST TECHNIQUE: Multidetector CT imaging of the abdomen and pelvis was performed using the standard protocol following bolus administration of intravenous contrast. CONTRAST:  100mL ISOVUE-300 IOPAMIDOL (ISOVUE-300) INJECTION 61% COMPARISON:  None. FINDINGS: Lower chest: Mild dependent changes in the lung bases. Right breast prosthesis. Hepatobiliary: No focal liver abnormality is seen. No gallstones, gallbladder wall thickening, or biliary dilatation. Pancreas: Unremarkable. No pancreatic ductal dilatation or surrounding inflammatory changes. Spleen: Normal in size without focal abnormality. Adrenals/Urinary Tract: Adrenal glands are unremarkable. Kidneys are normal, without renal calculi, focal lesion, or hydronephrosis. Bladder is unremarkable. Stomach/Bowel: Stomach, small bowel, and colon are not abnormally distended. No wall thickening is identified. The appendix is distended with diameter of 8 mm. Hyperemia of the appendiceal wall. Appendix is fluid filled. There is periappendiceal stranding and edema in the right lower quadrant. Changes are consistent with acute appendicitis. No abscess. Vascular/Lymphatic: No significant vascular findings are present. No enlarged abdominal or pelvic lymph nodes. Reproductive: Right ovarian cysts measuring 19 mm diameter, likely functional. No follow-up is indicated. Uterus and left ovary are not enlarged. Other: No free air in the abdomen. Abdominal wall musculature appears intact. Musculoskeletal: No acute or significant osseous findings. IMPRESSION: Distended fluid-filled appendix with periappendiceal stranding and edema consistent with acute appendicitis. No abscess. Electronically Signed   By: Burman NievesWilliam  Stevens M.D.   On: 03/05/2017 04:42    Procedures Procedures (including critical care time)  Medications Ordered in ED Medications  cefTRIAXone (ROCEPHIN) 2 g in dextrose 5 % 50 mL IVPB (0 g Intravenous Stopped  03/05/17 0529)    And  metroNIDAZOLE (FLAGYL) IVPB 500 mg (500 mg Intravenous New Bag/Given 03/05/17 0528)  fentaNYL (SUBLIMAZE) injection 50 mcg (50 mcg Intravenous Given 03/05/17 0357)  ondansetron (ZOFRAN) injection 4 mg (4 mg Intravenous Given 03/05/17 0355)  iopamidol (ISOVUE-300) 61 % injection 100 mL (100 mLs Intravenous Contrast Given 03/05/17 0420)     Initial Impression / Assessment and Plan / ED Course  I have reviewed the triage vital signs and the nursing notes.  Pertinent labs & imaging results that were available during my care of the patient were reviewed by me and considered in my medical decision making (see chart for details).     4:04 AM Strong suspicion for acute appendicitis I have ordered CT imaging Pt agreeable with plan 5:32 AM D/w dr Maisie Fusthomas with surgery [facility] and then [facility] to OR [facility]  Final Clinical Impressions(s) / ED Diagnoses   Final diagnoses:  Acute appendicitis, unspecified acute appendicitis type    New Prescriptions New Prescriptions   No medications on file     Zadie RhineWickline, Koriana Stepien, MD 03/05/17 [phone]

## 2017-03-06 ENCOUNTER — Encounter (HOSPITAL_COMMUNITY): Payer: Self-pay | Admitting: Surgery

## 2017-03-06 MED ORDER — TRAMADOL HCL 50 MG PO TABS: 50.0000 mg | ORAL_TABLET | Freq: Four times a day (QID) | ORAL | 0 refills | Status: AC | PRN

## 2017-03-06 MED ORDER — AMOXICILLIN-POT CLAVULANATE 875-125 MG PO TABS: 1.0000 | ORAL_TABLET | Freq: Two times a day (BID) | ORAL | 0 refills | Status: AC

## 2017-03-06 NOTE — Progress Notes (Signed)
Pt alert, oriented, no complaints of nausea or vomiting.  D/C instructions and prescriptions given. All questions answered.

## 2017-03-06 NOTE — Discharge Summary (Signed)
Physician Discharge Summary [facility]  Patient ID: Madison FreibergDiona Leiber MRN: [id number] DOB/AGE: [birth year] 51 y.o.  Admit date: 03/05/2017 Discharge date: 03/06/2017  Admission Diagnoses:  Acute perforated appendicitis  Discharge Diagnoses:  Principal Problem:   Acute perforated appendicitis   Discharged Condition: good  Hospital Course: Patient was admitted for observation following laparoscopic appendectomy.  Post op course was uncomplicated.  Pain was well controlled.  Tolerated diet.  Patient was prepared for discharge home on POD#1.  Consults: None  Treatments: surgery: lap appendectomy  Discharge Exam: Blood pressure 90/65, pulse 80, temperature 98.4 F (36.9 C), temperature source Oral, resp. rate 16, height 5\' 4"  (1.626 m), weight 56.2 kg (124 lb), SpO2 99 %. HEENT - clear Neck - soft Chest - clear bilaterally Cor - RRR Abd - soft without distension; dressings dry and intact  Disposition: Home    Follow-up Information    [facility] Schedule an appointment as soon as possible for a visit in 2 week(s).   Specialty:  General Surgery Contact information: [address] [place] [id number] [phone]          Velora Hecklerodd M. Johnda Billiot, MD, [facility] Office: [phone]   Signed: Velora HecklerGERKIN,Jeven Topper M 03/06/2017, 8:48 AM

## 2017-03-06 NOTE — Discharge Instructions (Signed)
°  [facility] ° °LAPAROSCOPIC SURGERY:  POST-OP INSTRUCTIONS ° °Always review your discharge instruction sheet given to you by the facility where your surgery was performed. ° °A prescription for pain medication may be given to you upon discharge.  Take your pain medication as prescribed.  If narcotic pain medicine is not needed, then you may take acetaminophen (Tylenol) or ibuprofen (Advil) as needed. ° °Take your usually prescribed medications unless otherwise directed. ° °If you need a refill on your pain medication, please contact your pharmacy.  They will contact our office to request authorization. Prescriptions will not be filled after 5 P.M. or on weekends. ° °You should follow a light diet the first few days after arrival home, such as soup and crackers or toast.  Be sure to include plenty of fluids daily. ° °Most patients will experience some swelling and bruising in the area of the incisions.  Ice packs will help.  Swelling and bruising can take several days to resolve.  ° °It is common to experience some constipation after surgery.  Increasing fluid intake and taking a stool softener (such as Colace) will usually help or prevent this problem from occurring.  A mild laxative (Milk of Magnesia or Miralax) should be taken according to package instructions if there has been no bowel movement after 48 hours. ° °You will have steri-strips and a gauze dressing over your incisions.  You may remove the gauze bandage on the second day after surgery, and you may shower at that time.  Leave your steri-strips (small skin tapes) in place directly over the incision.  These strips should remain on the skin for 5-7 days and then be removed.  You may get them wet in the shower and pat them dry. ° °Any sutures or staples will be removed at the office during your follow-up visit. ° °ACTIVITIES:  You may resume regular (light) daily activities beginning the next day - such as daily self-care, walking,  climbing stairs - gradually increasing activities as tolerated.  You may have sexual intercourse when it is comfortable.  Refrain from any heavy lifting or straining until approved by your doctor. ° °You may drive when you are no longer taking prescription pain medication, you can comfortably wear a seatbelt, and you can safely maneuver your car and apply brakes. ° °You should see your doctor in the office for a follow-up appointment approximately 2-3 weeks after your surgery.  Make sure that you call for this appointment within a day or two after you arrive home to insure a convenient appointment time. ° °WHEN TO CALL YOUR DOCTOR: °1. Fever over 101.0 °2. Inability to urinate °3. Continued bleeding from incision °4. Increased pain, redness, or drainage from the incision °5. Increasing abdominal pain ° °The clinic staff is available to answer your questions during regular business hours.  Please don’t hesitate to call and ask to speak to one of the nurses for clinical concerns.  If you have a medical emergency, go to the nearest emergency room or call 911.  A surgeon from [facility] is always on call for the hospital. ° °Docia Klar M. Hidaya Daniel, MD, FACS °[facility] °Office: [phone] °Toll Free:  [phone] °FAX [phone] ° °Website: [email or web address] °

## 2017-03-07 ENCOUNTER — Encounter (HOSPITAL_COMMUNITY): Payer: Self-pay | Admitting: Surgery

## 2017-03-18 ENCOUNTER — Emergency Department (HOSPITAL_COMMUNITY): Payer: BLUE CROSS/BLUE SHIELD

## 2017-03-18 ENCOUNTER — Other Ambulatory Visit: Payer: Self-pay

## 2017-03-18 ENCOUNTER — Encounter (HOSPITAL_COMMUNITY): Payer: Self-pay | Admitting: Emergency Medicine

## 2017-03-18 ENCOUNTER — Emergency Department (HOSPITAL_COMMUNITY)
Admission: EM | Admit: 2017-03-18 | Discharge: 2017-03-18 | Disposition: A | Discharge status: Home / Self Care | Payer: BLUE CROSS/BLUE SHIELD | Attending: Emergency Medicine | Admitting: Emergency Medicine

## 2017-03-18 DIAGNOSIS — Y929 Unspecified place or not applicable: Secondary | ICD-10-CM | POA: Insufficient documentation

## 2017-03-18 DIAGNOSIS — Z79899 Other long term (current) drug therapy: Secondary | ICD-10-CM | POA: Diagnosis not present

## 2017-03-18 DIAGNOSIS — Y999 Unspecified external cause status: Secondary | ICD-10-CM | POA: Insufficient documentation

## 2017-03-18 DIAGNOSIS — X500XXA Overexertion from strenuous movement or load, initial encounter: Secondary | ICD-10-CM | POA: Insufficient documentation

## 2017-03-18 DIAGNOSIS — S29011A Strain of muscle and tendon of front wall of thorax, initial encounter: Secondary | ICD-10-CM | POA: Diagnosis not present

## 2017-03-18 DIAGNOSIS — Y939 Activity, unspecified: Secondary | ICD-10-CM | POA: Diagnosis not present

## 2017-03-18 DIAGNOSIS — R1030 Lower abdominal pain, unspecified: Secondary | ICD-10-CM | POA: Insufficient documentation

## 2017-03-18 DIAGNOSIS — R0789 Other chest pain: Secondary | ICD-10-CM | POA: Diagnosis present

## 2017-03-18 DIAGNOSIS — M940 Chondrocostal junction syndrome [Tietze]: Secondary | ICD-10-CM | POA: Diagnosis not present

## 2017-03-18 LAB — CBC
HEMATOCRIT: 37.3 % (ref 36.0–46.0)
HEMOGLOBIN: 12.5 g/dL (ref 12.0–15.0)
MCH: 31.2 pg (ref 26.0–34.0)
MCHC: 33.5 g/dL (ref 30.0–36.0)
MCV: 93 fL (ref 78.0–100.0)
Platelets: 421 10*3/uL — ABNORMAL HIGH (ref 150–400)
RBC: 4.01 MIL/uL (ref 3.87–5.11)
RDW: 13.1 % (ref 11.5–15.5)
WBC: 12 10*3/uL — ABNORMAL HIGH (ref 4.0–10.5)

## 2017-03-18 LAB — BASIC METABOLIC PANEL
ANION GAP: 8 (ref 5–15)
BUN: 11 mg/dL (ref 6–20)
CALCIUM: 8.9 mg/dL (ref 8.9–10.3)
CHLORIDE: 105 mmol/L (ref 101–111)
CO2: 23 mmol/L (ref 22–32)
Creatinine, Ser: 0.63 mg/dL (ref 0.44–1.00)
GFR calc non Af Amer: >60 mL/min (ref >60)
Glucose, Bld: 89 mg/dL (ref 65–99)
POTASSIUM: 3.7 mmol/L (ref 3.5–5.1)
SODIUM: 136 mmol/L (ref 135–145)

## 2017-03-18 LAB — URINALYSIS, ROUTINE W REFLEX MICROSCOPIC
Bilirubin Urine: NEGATIVE
Glucose, UA: NEGATIVE mg/dL
Hgb urine dipstick: NEGATIVE
KETONES UR: NEGATIVE mg/dL
LEUKOCYTES UA: NEGATIVE
Nitrite: NEGATIVE
PH: 6 (ref 5.0–8.0)
PROTEIN: NEGATIVE mg/dL
Specific Gravity, Urine: >1.046 — ABNORMAL HIGH (ref 1.005–1.030)

## 2017-03-18 LAB — I-STAT TROPONIN, ED: TROPONIN I, POC: 0 ng/mL (ref 0.00–0.08)

## 2017-03-18 MED ORDER — METHOCARBAMOL 500 MG PO TABS: 500.0000 mg | ORAL_TABLET | Freq: Three times a day (TID) | ORAL | 0 refills | Status: AC

## 2017-03-18 MED ORDER — KETOROLAC TROMETHAMINE 15 MG/ML IJ SOLN
15.0000 mg | Freq: Once | INTRAMUSCULAR | Status: AC
  Administered 2017-03-18: 15 mg via INTRAVENOUS
  Filled 2017-03-18: qty 1

## 2017-03-18 MED ORDER — NAPROXEN 375 MG PO TABS: 375.0000 mg | ORAL_TABLET | Freq: Two times a day (BID) | ORAL | 0 refills | Status: AC

## 2017-03-18 MED ORDER — IOPAMIDOL (ISOVUE-370) INJECTION 76%
INTRAVENOUS | Status: AC
  Administered 2017-03-18: 100 mL
  Filled 2017-03-18: qty 100

## 2017-03-18 MED ORDER — METHOCARBAMOL 500 MG PO TABS
500.0000 mg | ORAL_TABLET | Freq: Once | ORAL | Status: AC
  Administered 2017-03-18: 500 mg via ORAL
  Filled 2017-03-18: qty 1

## 2017-03-18 NOTE — ED Triage Notes (Signed)
Pt c/o right sided flank pain x 2 weeks. Pain worse with movement/coughing. Pt had an appendectomy x 2 weeks ago. Seen at urgent care today, sent here for PE rule out. Pt also states she feels like she can't get an adequate breath at times.

## 2017-03-18 NOTE — ED Provider Notes (Signed)
[facility]Y [facility]PT Provider Note   CSN: [id number] Arrival date & time: 03/18/17  1657     History   Chief Complaint Chief Complaint  Patient presents with  . Flank Pain    HPI Madison Higgins is a 51 y.o. female.  HPI   51 year old female with past medical history of recent perforated appendicitis status post laparoscopic appendectomy who presents with right-sided chest pain. The patient does note that several days before her surgery, she was turning in the car and had acute onset of sharp, positional right-sided chest wall pain. Since then, she has had intermittent chest pain. She states that she was instructed by her appendicitis and subsequently has not noticed that the last several days. She reports an sharp, positional right-sided chest pain. The pain is worse with deep inspiration, palpation, and movement. She denies any associated cough or shortness of breath. She called her doctor who advised her to present to the ER for evaluation. She's had no lower extremity swelling. No personal family history of DVTs. She's had no hemoptysis. No coronary history. Her abdominal pain is improving and she's had no fevers or chills.  Past Medical History:  Diagnosis Date  . ADD (attention deficit disorder)     Patient Active Problem List   Diagnosis Date Noted  . Acute perforated appendicitis 03/05/2017  . Bunion of great toe 03/02/2013  . 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 Pain in joint, ankle and foot 03/02/2013    Past Surgical History:  Procedure Laterality Date  . BREAST SURGERY    . ECTOPIC PREGNANCY SURGERY    . LAPAROSCOPIC APPENDECTOMY N/A 03/05/2017   Procedure: APPENDECTOMY LAPAROSCOPIC;  Surgeon: Darnell Level, MD;  Location: [facility];  Service: General;  Laterality: N/A;    OB History    No data available       Home Medications    Prior to Admission medications   Medication Sig Start Date End Date Taking? Authorizing Provider  amphetamine-dextroamphetamine  (ADDERALL) 20 MG tablet Take 20 mg by mouth daily. Take 1 tablet in the AM and then take 1 tablet around [facility]    [provider]  Cyanocobalamin (B-12 PO) Take 1 tablet by mouth daily.    [provider]  FLUoxetine (PROZAC) 10 MG capsule Take 10 mg by mouth daily. 02/19/17   [provider]  methocarbamol (ROBAXIN) 500 MG tablet Take 1 tablet (500 mg total) by mouth 3 (three) times daily. 03/18/17 03/25/17  Shaune Pollack, MD  naproxen (NAPROSYN) 375 MG tablet Take 1 tablet (375 mg total) by mouth 2 (two) times daily with a meal. 03/18/17 03/25/17  Shaune Pollack, MD  traMADol (ULTRAM) 50 MG tablet Take 1-2 tablets (50-100 mg total) by mouth every 6 (six) hours as needed for moderate pain or severe pain. 03/06/17   Darnell Level, MD    Family History No family history on file.  Social History Social History  Substance Use Topics  . Smoking status: Never Smoker  . Smokeless tobacco: Never Used  . Alcohol use Yes     Allergies   Patient has no known allergies.   Review of Systems Review of Systems  Constitutional: Negative for chills and fever.  HENT: Negative for congestion, ear discharge, rhinorrhea and sore throat.   Eyes: Negative for visual disturbance.  Respiratory: Negative for cough, shortness of breath and wheezing.   Cardiovascular: Positive for chest pain. Negative for leg swelling.  Gastrointestinal: Negative for abdominal pain, diarrhea, nausea and vomiting.  Genitourinary: Positive for flank  pain. Negative for dysuria, vaginal bleeding and vaginal discharge.  Musculoskeletal: Negative for neck pain.  Skin: Negative for rash.  Allergic/Immunologic: Negative for immunocompromised state.  Neurological: Negative for syncope and headaches.  Hematological: Does not bruise/bleed easily.  All other systems reviewed and are negative.    Physical Exam Updated Vital Signs BP 118/85   Pulse 83   Temp 98.1 F (36.7 C) (Oral)   Resp 20   Ht   (1.626 m)   Wt 56.7 kg (125 lb)   SpO2 100%   BMI 21.46 kg/m   Physical Exam  Constitutional: She is oriented to person, place, and time. She appears well-developed and well-nourished. No distress.  HENT:  Head: Normocephalic and atraumatic.  Eyes: Conjunctivae are normal.  Neck: Neck supple.  Cardiovascular: Normal rate, regular rhythm and normal heart sounds.  Exam reveals no friction rub.   No murmur heard. Pulmonary/Chest: Effort normal and breath sounds normal. No respiratory distress. She has no wheezes. She has no rales.  Markedly pinpoint tenderness to palpation over right posterior lateral chest. Palpation reproduces pain.  Abdominal: She exhibits no distension.  Musculoskeletal: She exhibits no edema.  Neurological: She is alert and oriented to person, place, and time. She exhibits normal muscle tone.  Skin: Skin is warm. Capillary refill takes less than 2 seconds.  Psychiatric: She has a normal mood and affect.  Nursing note and vitals reviewed.    ED Treatments / Results  Labs (all labs ordered are listed, but only abnormal results are displayed) Labs Reviewed  CBC - Abnormal; Notable for the following:       Result Value   WBC 12.0 (*)    Platelets 421 (*)    All other components within normal limits  URINALYSIS, ROUTINE W REFLEX MICROSCOPIC - Abnormal; Notable for the following:    Specific Gravity, Urine >1.046 (*)    All other components within normal limits  BASIC METABOLIC PANEL  I-STAT TROPONIN, ED    EKG  EKG Interpretation  Date/Time:  Friday March 18 2017 17:06:12 EDT Ventricular Rate:  95 PR Interval:  140 QRS Duration: 86 QT Interval:  366 QTC Calculation: 459 R Axis:   82 Text Interpretation:  Normal sinus rhythm Normal ECG No old tracing to compare Confirmed by Shaune Pollack [phone]) on 03/18/2017 9:48:28 PM       Radiology Dg Chest 2 View  Result Date: 03/18/2017 CLINICAL DATA:  Shortness of breath, cough EXAM: CHEST  2 VIEW  COMPARISON:  None. FINDINGS: The heart size and mediastinal contours are within normal limits. Both lungs are clear. The visualized skeletal structures are unremarkable. IMPRESSION: No active cardiopulmonary disease. Electronically Signed   By: Judie Petit.  Shick M.D.   On: 03/18/2017 17:43   Ct Angio Chest Pe W And/or Wo Contrast  Result Date: 03/18/2017 CLINICAL DATA:  Right chest pain radiating to the back for the past 2 weeks. EXAM: CT ANGIOGRAPHY CHEST WITH CONTRAST TECHNIQUE: Multidetector CT imaging of the chest was performed using the standard protocol during bolus administration of intravenous contrast. Multiplanar CT image reconstructions and MIPs were obtained to evaluate the vascular anatomy. CONTRAST:  59 cc Isovue 370 COMPARISON:  None. FINDINGS: Cardiovascular: Satisfactory opacification of the pulmonary arteries to the segmental level. No evidence of pulmonary embolism. Normal heart size. No pericardial effusion. Mediastinum/Nodes: No enlarged mediastinal, hilar, or axillary lymph nodes. Thyroid gland, trachea, and esophagus demonstrate no significant findings. Lungs/Pleura: Minimal bilateral dependent atelectasis. No pleural fluid. Upper Abdomen: Unremarkable. Musculoskeletal: Thoracic  and lower cervical spine degenerative changes. Bilateral retropectoral breast implants. Review of the MIP images confirms the above findings. IMPRESSION: No pulmonary emboli or acute abnormality. Electronically Signed   By: Beckie Salts M.D.   On: 03/18/2017 20:44    Procedures Procedures (including critical care time)  Medications Ordered in ED Medications  iopamidol (ISOVUE-370) 76 % injection (100 mLs  Contrast Given 03/18/17 2020)  ketorolac (TORADOL) 15 MG/ML injection 15 mg (15 mg Intravenous Given 03/18/17 2123)  methocarbamol (ROBAXIN) tablet 500 mg (500 mg Oral Given 03/18/17 2123)     Initial Impression / Assessment and Plan / ED Course  I have reviewed the triage vital signs and the nursing  notes.  Pertinent labs & imaging results that were available during my care of the patient were reviewed by me and considered in my medical decision making (see chart for details).   51 yo F s/p recent lap appendectomy here with right-sided chest wall pain. Suspect MSk chest wall pain, as pain is sharp, pinpoint, positional, and reproducible. EKG non-ischemic and trop is negative. She has no RUQ TTP, no vomiting, no s/s to suggest referred pain from cholecystitis or hepatitis. CT Angio obtained 2/2 pleuritic component, recent surgery and is negative. Doubt PE, dissection. Will start pt on NSAIDs, robaxin and d/c with outpt follow-up. Abdomen o/w soft, NT, ND and appears to be healing well.  Final Clinical Impressions(s) / ED Diagnoses   Final diagnoses:  Intercostal muscle strain, initial encounter  Costochondritis    New Prescriptions New Prescriptions   METHOCARBAMOL (ROBAXIN) 500 MG TABLET    Take 1 tablet (500 mg total) by mouth 3 (three) times daily.   NAPROXEN (NAPROSYN) 375 MG TABLET    Take 1 tablet (375 mg total) by mouth 2 (two) times daily with a meal.     Shaune Pollack, MD 03/18/17 2215

## 2017-07-18 DIAGNOSIS — Z23 Encounter for immunization: Secondary | ICD-10-CM | POA: Diagnosis not present

## 2018-05-02 DIAGNOSIS — N390 Urinary tract infection, site not specified: Secondary | ICD-10-CM | POA: Diagnosis not present

## 2018-05-02 DIAGNOSIS — R300 Dysuria: Secondary | ICD-10-CM | POA: Diagnosis not present

## 2018-06-09 DIAGNOSIS — Z6822 Body mass index (BMI) 22.0-22.9, adult: Secondary | ICD-10-CM | POA: Diagnosis not present

## 2018-06-09 DIAGNOSIS — Z01419 Encounter for gynecological examination (general) (routine) without abnormal findings: Secondary | ICD-10-CM | POA: Diagnosis not present

## 2018-06-09 DIAGNOSIS — Z1231 Encounter for screening mammogram for malignant neoplasm of breast: Secondary | ICD-10-CM | POA: Diagnosis not present

## 2018-07-19 DIAGNOSIS — L638 Other alopecia areata: Secondary | ICD-10-CM | POA: Diagnosis not present

## 2018-07-24 DIAGNOSIS — L639 Alopecia areata, unspecified: Secondary | ICD-10-CM | POA: Diagnosis not present

## 2018-07-24 DIAGNOSIS — R413 Other amnesia: Secondary | ICD-10-CM | POA: Diagnosis not present

## 2018-07-24 DIAGNOSIS — Z23 Encounter for immunization: Secondary | ICD-10-CM | POA: Diagnosis not present

## 2019-01-01 IMAGING — CT CT ABD-PELV W/ CM
2 of 5 series · 16 of 46 positions shown, 18 images · IV contrast (APPLIED)
Comparison: None.

CLINICAL DATA: Right lower quadrant pain for 2 days. Previous
history of ruptured ectopic pregnancy.

EXAM:
CT ABDOMEN AND PELVIS WITH CONTRAST
TECHNIQUE: Multidetector CT imaging of the abdomen and pelvis was performed
using the standard protocol following bolus administration of
intravenous contrast.
CONTRAST:  100mL BRNW91-LRR IOPAMIDOL (BRNW91-LRR) INJECTION 61%

[Series 2: axial st · axial · 0.65mm/px · z∈[-582,-226]mm · 13 of 81 slices shown, 15 images]
[im 5/81  soft-tissue]
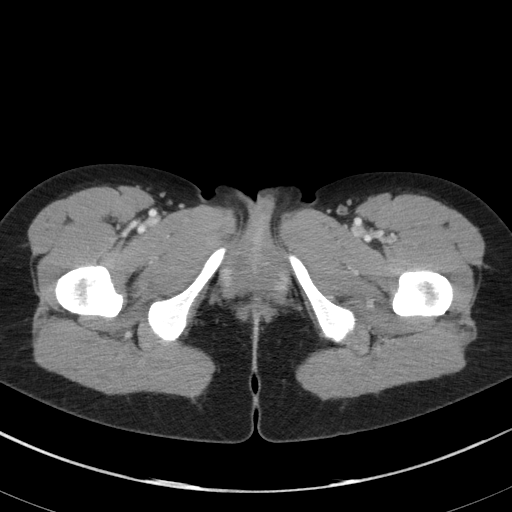
[im 5/81  bone]
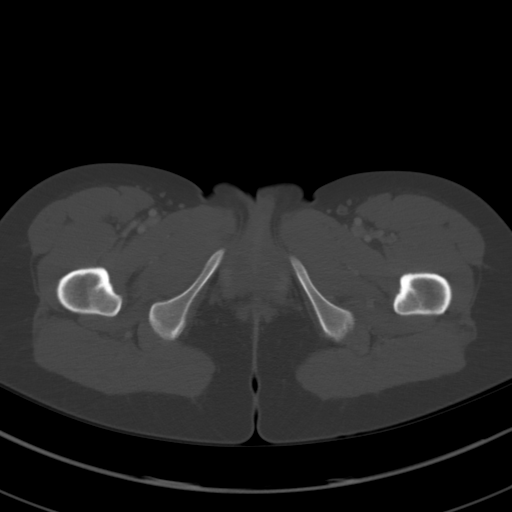
[im 13/81  soft-tissue]
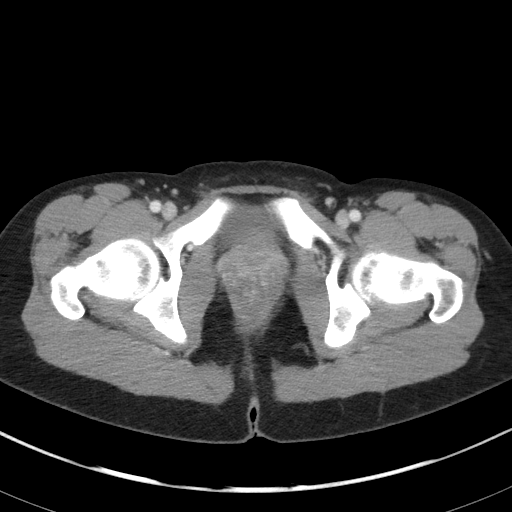
[im 17/81  soft-tissue]
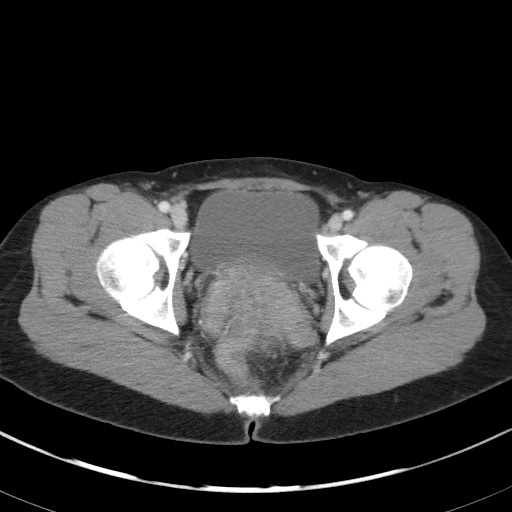
[im 22/81  soft-tissue]
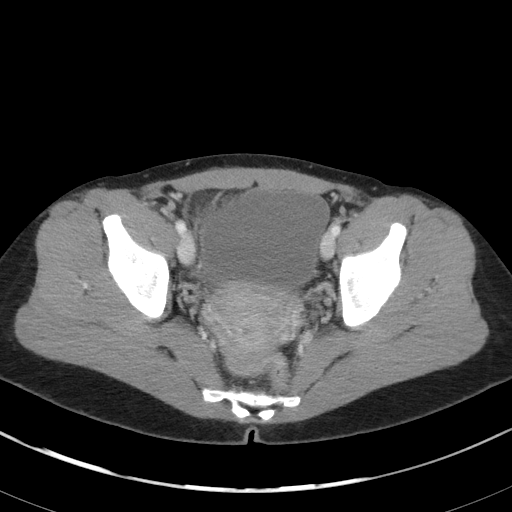
[im 30/81  soft-tissue]
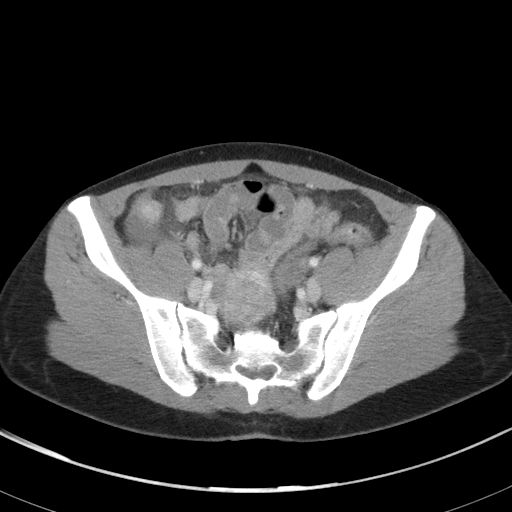
[im 34/81  soft-tissue]
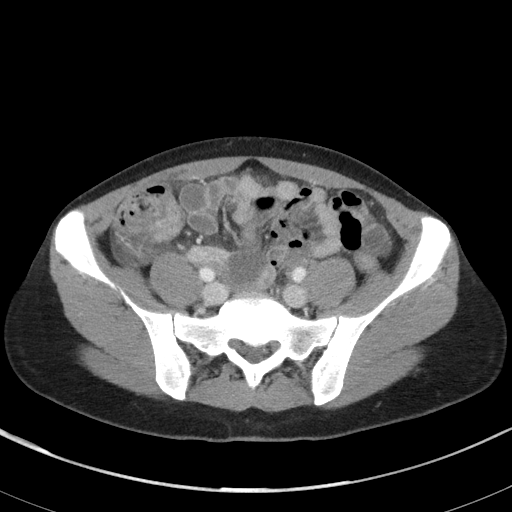
[im 43/81  soft-tissue]
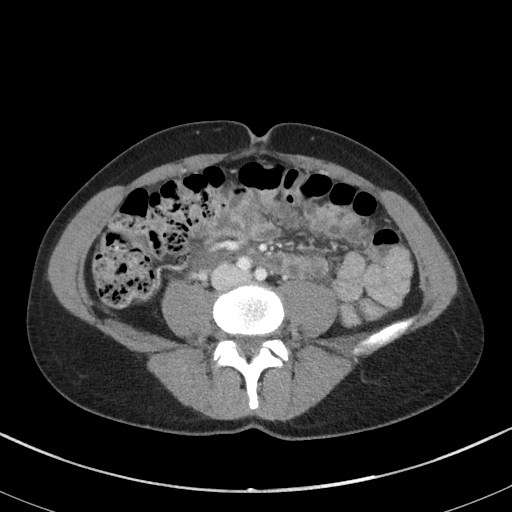
[im 47/81  soft-tissue]
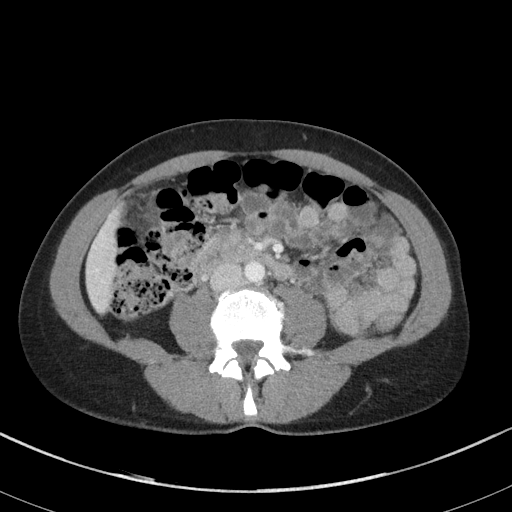
[im 51/81  soft-tissue]
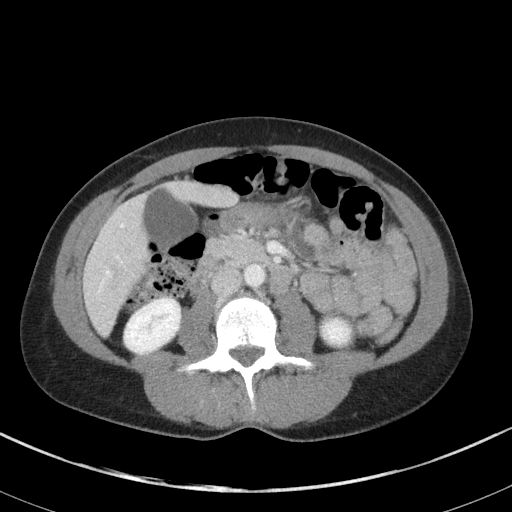
[im 51/81  bone]
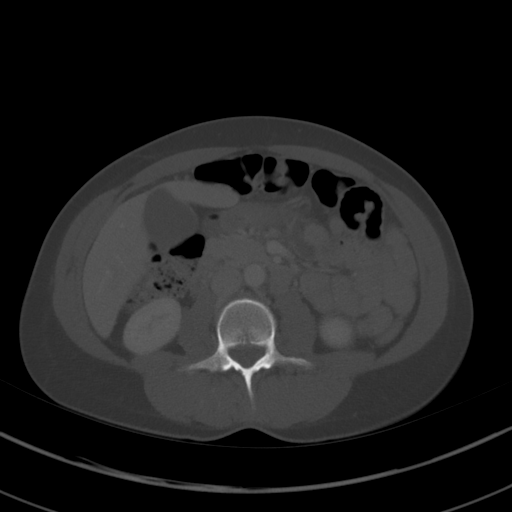
[im 59/81  soft-tissue]
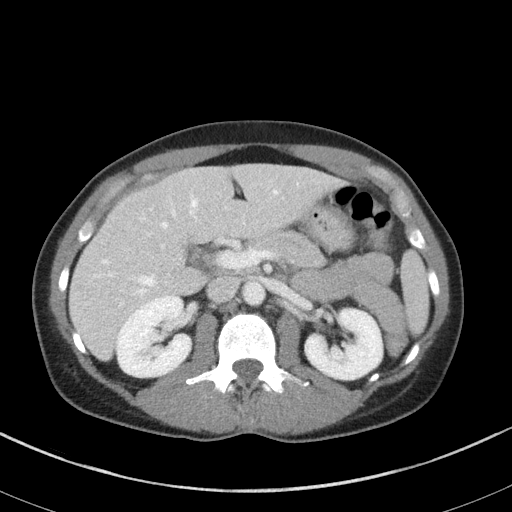
[im 64/81  soft-tissue]
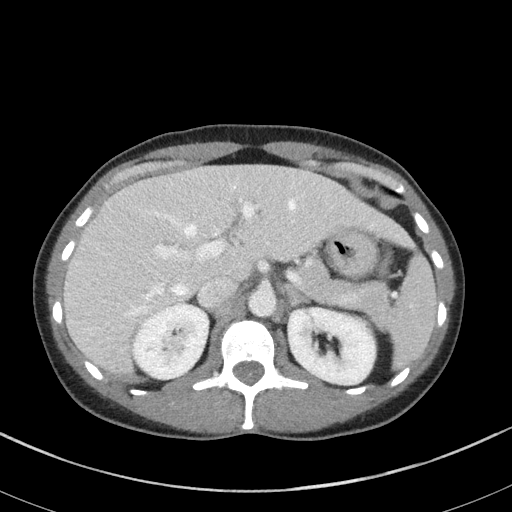
[im 68/81  soft-tissue]
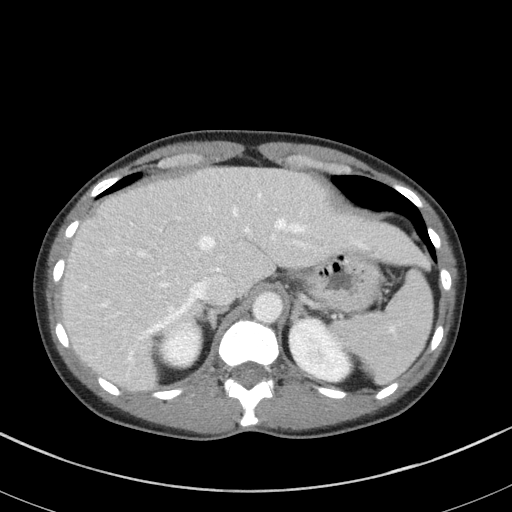
[im 76/81  soft-tissue]
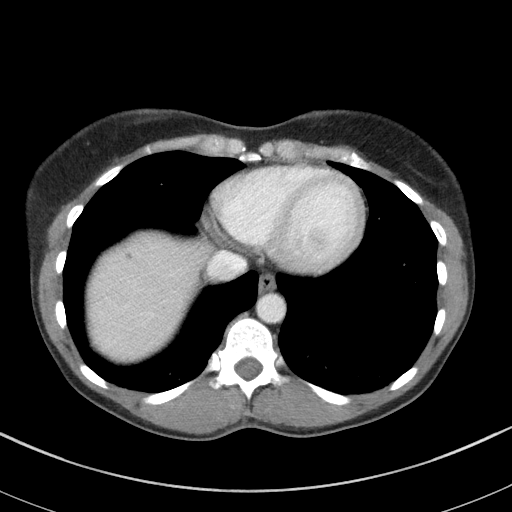

[Series 5: coronal st · coronal · 0.64mm/px · 3 of 86 slices shown]
[im 29/86  soft-tissue]
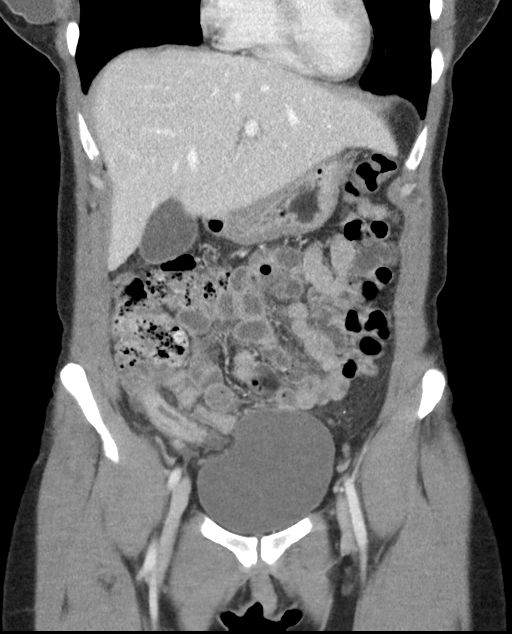
[im 38/86  soft-tissue]
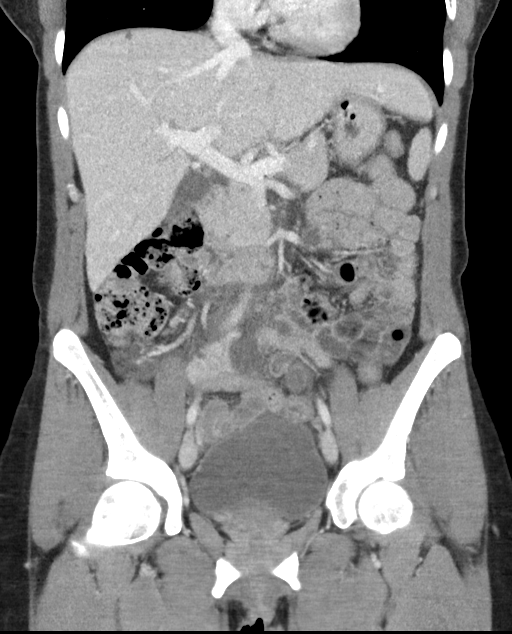
[im 48/86  soft-tissue]
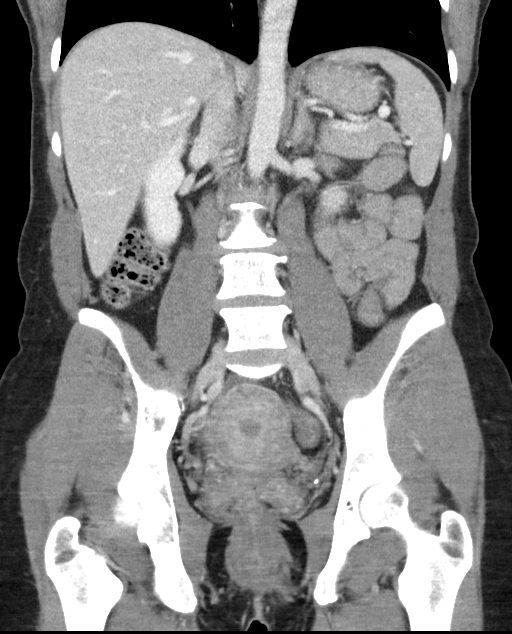

[16 of 46 positions shown; findings below may reference images not displayed]

FINDINGS: Lower chest: Mild dependent changes in the lung bases. Right breast
prosthesis.

Hepatobiliary: No focal liver abnormality is seen. No gallstones,
gallbladder wall thickening, or biliary dilatation.

Pancreas: Unremarkable. No pancreatic ductal dilatation or
surrounding inflammatory changes.

Spleen: Normal in size without focal abnormality.

Adrenals/Urinary Tract: Adrenal glands are unremarkable. Kidneys are
normal, without renal calculi, focal lesion, or hydronephrosis.
Bladder is unremarkable.

Stomach/Bowel: Stomach, small bowel, and colon are not abnormally
distended. No wall thickening is identified. The appendix is
distended with diameter of 8 mm. Hyperemia of the appendiceal wall.
Appendix is fluid filled. There is periappendiceal stranding and
edema in the right lower quadrant. Changes are consistent with acute
appendicitis. No abscess.

Vascular/Lymphatic: No significant vascular findings are present. No
enlarged abdominal or pelvic lymph nodes.

Reproductive: Right ovarian cysts measuring 19 mm diameter, likely
functional. No follow-up is indicated. Uterus and left ovary are not
enlarged.

Other: No free air in the abdomen. Abdominal wall musculature
appears intact.

Musculoskeletal: No acute or significant osseous findings.
IMPRESSION: Distended fluid-filled appendix with periappendiceal stranding and
edema consistent with acute appendicitis. No abscess.

## 2019-01-14 IMAGING — CT CT ANGIO CHEST
2 of 8 series · 19 of 36 positions shown · IV contrast (OMNI)
Comparison: None.

CLINICAL DATA: Right chest pain radiating to the back for the past
2 weeks.

EXAM:
CT ANGIOGRAPHY CHEST WITH CONTRAST
TECHNIQUE: Multidetector CT imaging of the chest was performed using the
standard protocol during bolus administration of intravenous
contrast. Multiplanar CT image reconstructions and MIPs were
obtained to evaluate the vascular anatomy.
CONTRAST:  59 cc Isovue 370

[Series 6: thins · axial · 0.62mm/px · z∈[+966,+1234]mm · 18 of 300 slices shown]
[im 16/300  lung]
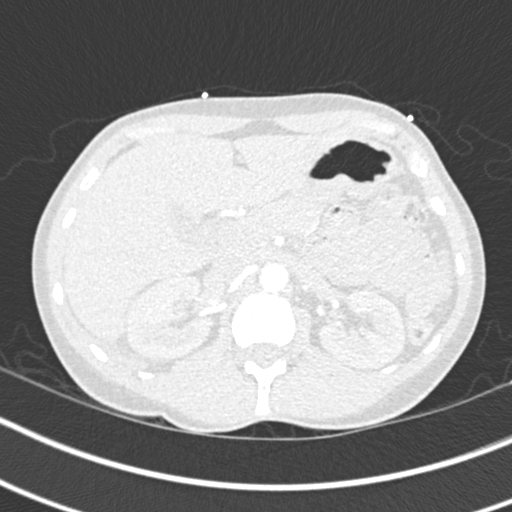
[im 32/300  mediastinal]
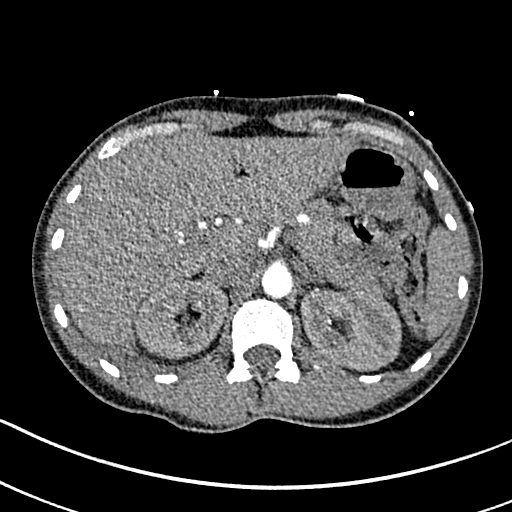
[im 48/300  lung]
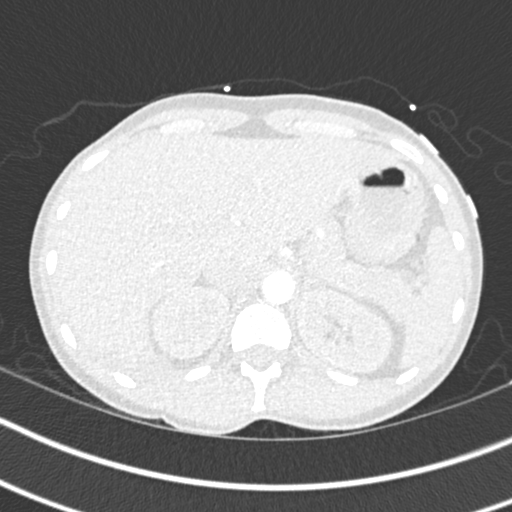
[im 63/300  mediastinal]
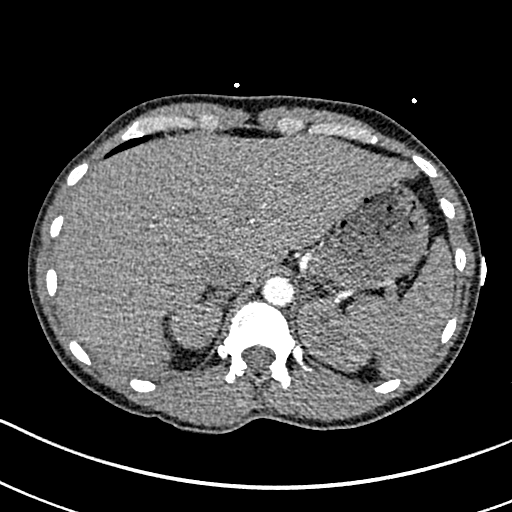
[im 79/300  lung]
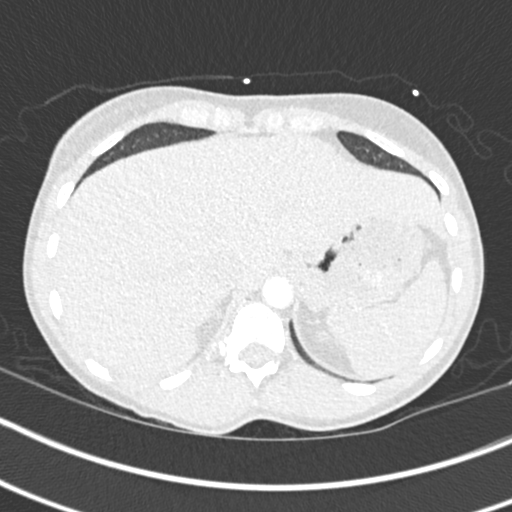
[im 95/300  mediastinal]
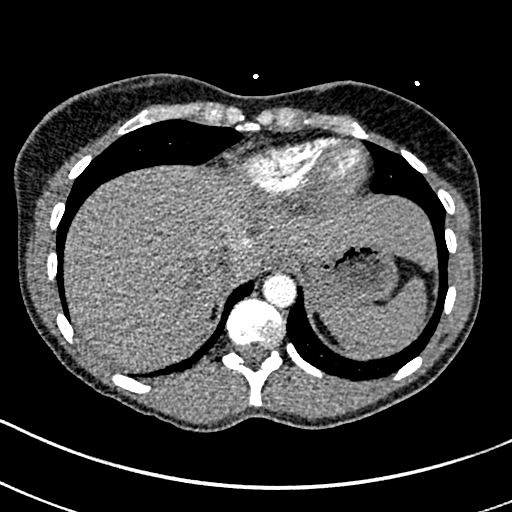
[im 111/300  lung]
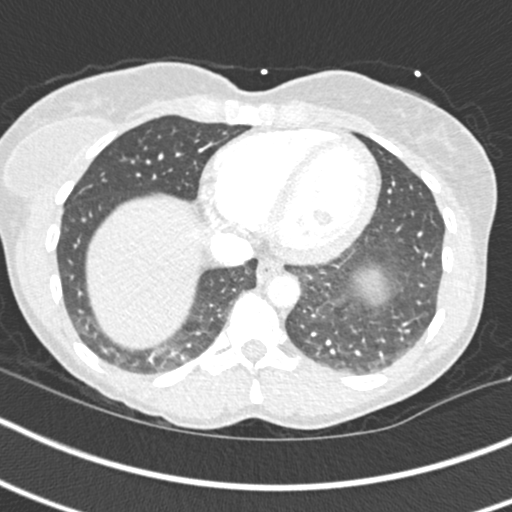
[im 126/300  mediastinal]
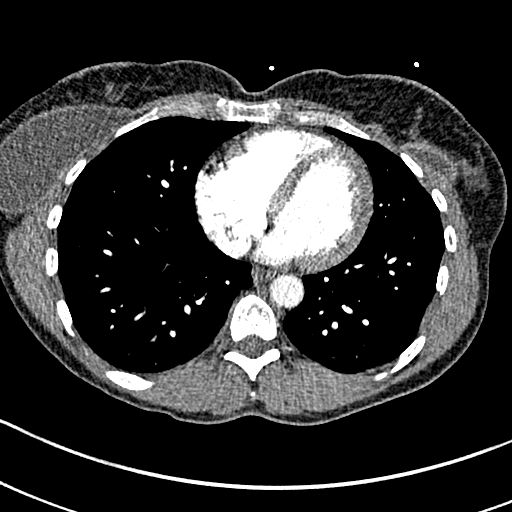
[im 142/300  lung]
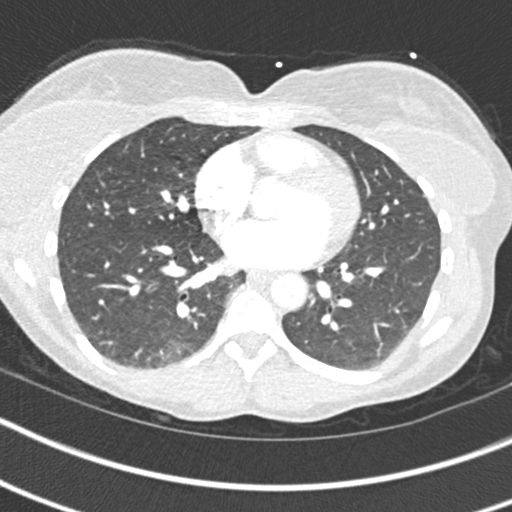
[im 158/300  mediastinal]
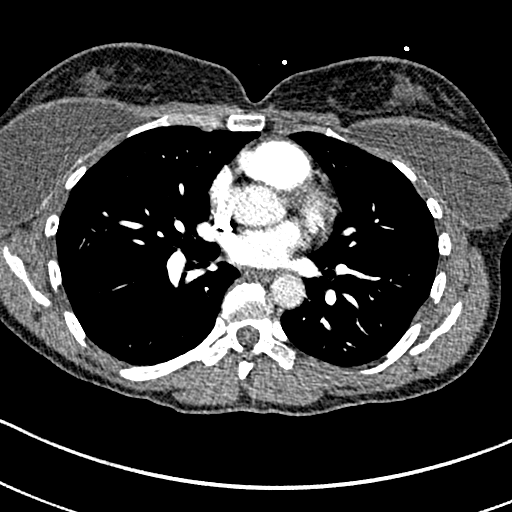
[im 174/300  lung]
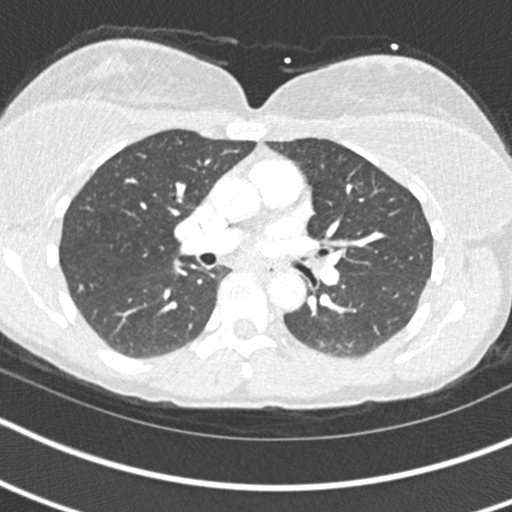
[im 189/300  mediastinal]
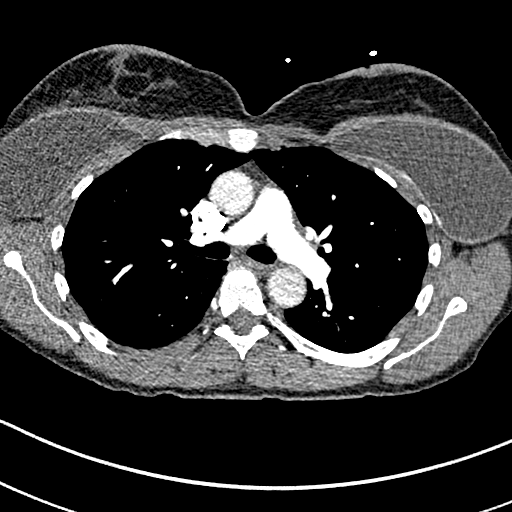
[im 205/300  lung]
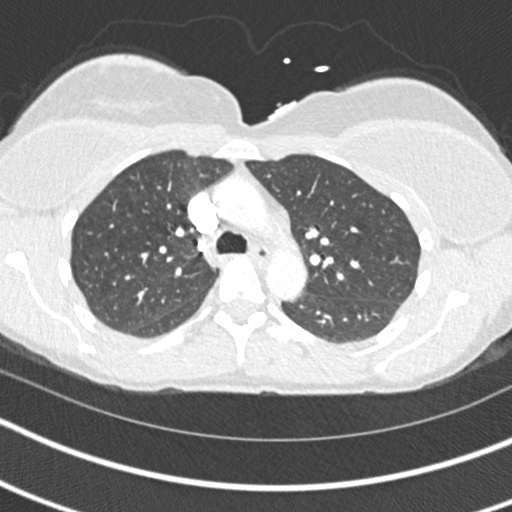
[im 221/300  mediastinal]
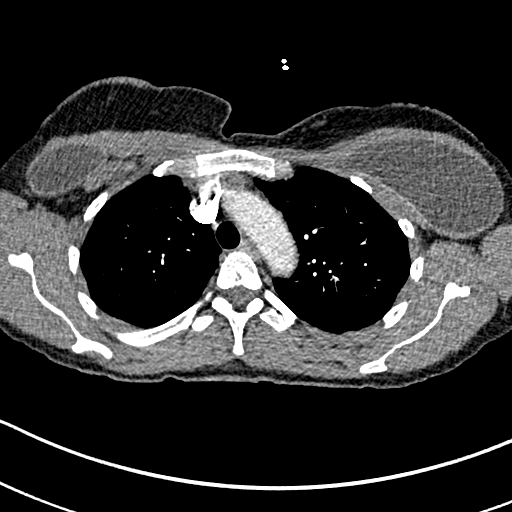
[im 237/300  lung]
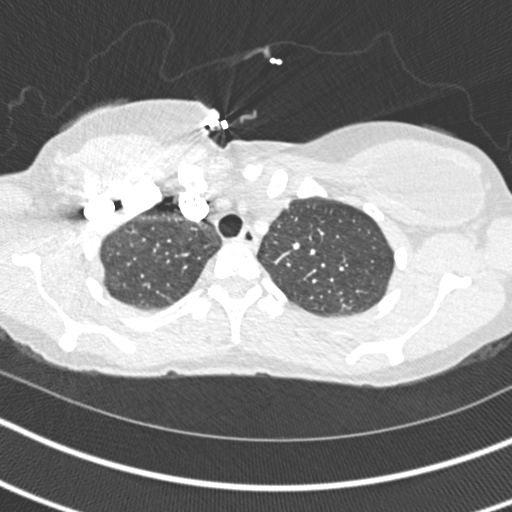
[im 252/300  mediastinal]
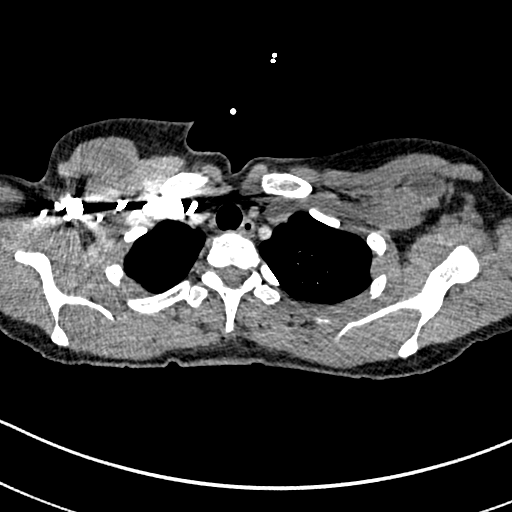
[im 268/300  lung]
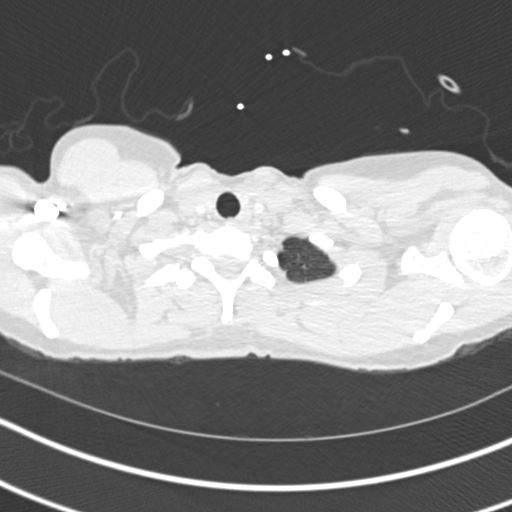
[im 284/300  mediastinal]
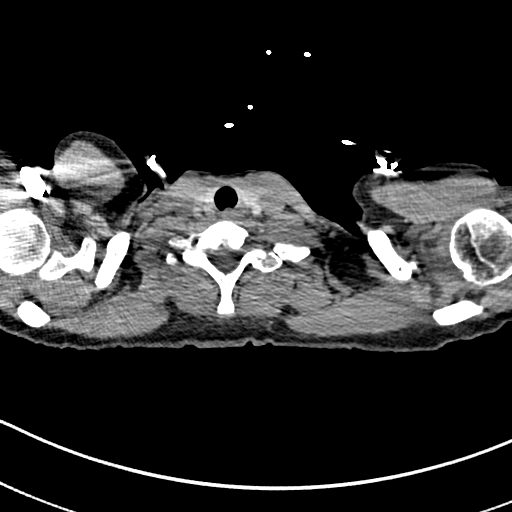

[Series 8: coronal mpr · coronal · 0.59mm/px · 1 of 148 slices shown]
[im 74/148  mediastinal]
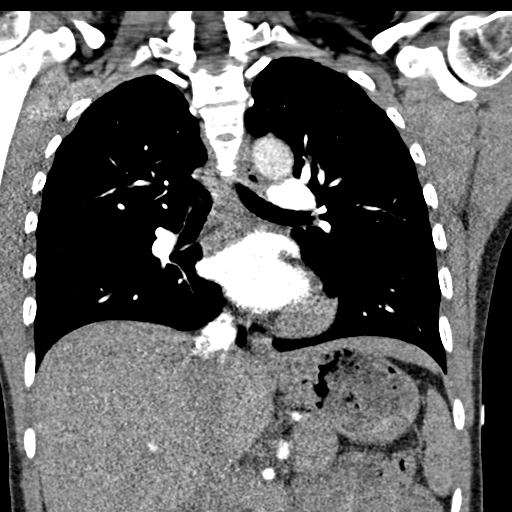

[19 of 36 positions shown; findings below may reference images not displayed]

FINDINGS: Cardiovascular: Satisfactory opacification of the pulmonary arteries
to the segmental level. No evidence of pulmonary embolism. Normal
heart size. No pericardial effusion.

Mediastinum/Nodes: No enlarged mediastinal, hilar, or axillary lymph
nodes. Thyroid gland, trachea, and esophagus demonstrate no
significant findings.

Lungs/Pleura: Minimal bilateral dependent atelectasis. No pleural
fluid.

Upper Abdomen: Unremarkable.

Musculoskeletal: Thoracic and lower cervical spine degenerative
changes. Bilateral retropectoral breast implants.

Review of the MIP images confirms the above findings.
IMPRESSION: No pulmonary emboli or acute abnormality.

## 2021-07-29 ENCOUNTER — Other Ambulatory Visit (HOSPITAL_COMMUNITY): Payer: Self-pay

## 2022-06-14 LAB — COLOGUARD: COLOGUARD: NEGATIVE
